# Patient Record
Sex: Female | Born: 1935 | Race: Black or African American | Hispanic: No | Smoking: Current every day smoker
Health system: Southern US, Community
[De-identification: ages and names within clinical notes are randomized; demographics above are authoritative.]

## PROBLEM LIST (undated history)

## (undated) DIAGNOSIS — I10 Essential (primary) hypertension: Secondary | ICD-10-CM

## (undated) DIAGNOSIS — N289 Disorder of kidney and ureter, unspecified: Secondary | ICD-10-CM

## (undated) DIAGNOSIS — E119 Type 2 diabetes mellitus without complications: Secondary | ICD-10-CM

## (undated) DIAGNOSIS — K219 Gastro-esophageal reflux disease without esophagitis: Secondary | ICD-10-CM

## (undated) DIAGNOSIS — F0390 Unspecified dementia without behavioral disturbance: Secondary | ICD-10-CM

## (undated) DIAGNOSIS — J449 Chronic obstructive pulmonary disease, unspecified: Secondary | ICD-10-CM

---

## 2003-12-11 ENCOUNTER — Other Ambulatory Visit: Payer: Self-pay

## 2005-03-20 ENCOUNTER — Emergency Department: Payer: Self-pay | Admitting: Emergency Medicine

## 2005-03-20 ENCOUNTER — Other Ambulatory Visit: Payer: Self-pay

## 2005-04-12 ENCOUNTER — Inpatient Hospital Stay: Payer: Self-pay | Admitting: Internal Medicine

## 2005-04-12 ENCOUNTER — Other Ambulatory Visit: Payer: Self-pay

## 2005-10-18 ENCOUNTER — Inpatient Hospital Stay: Payer: Self-pay | Admitting: Unknown Physician Specialty

## 2005-10-18 ENCOUNTER — Other Ambulatory Visit: Payer: Self-pay

## 2006-03-16 ENCOUNTER — Emergency Department: Payer: Self-pay | Admitting: Emergency Medicine

## 2006-06-05 ENCOUNTER — Ambulatory Visit: Payer: Self-pay | Admitting: Internal Medicine

## 2006-11-24 ENCOUNTER — Ambulatory Visit: Payer: Self-pay | Admitting: Unknown Physician Specialty

## 2007-06-08 ENCOUNTER — Ambulatory Visit: Payer: Self-pay | Admitting: Internal Medicine

## 2007-07-16 ENCOUNTER — Ambulatory Visit: Payer: Self-pay

## 2007-07-31 ENCOUNTER — Observation Stay: Payer: Self-pay | Admitting: Internal Medicine

## 2007-07-31 ENCOUNTER — Other Ambulatory Visit: Payer: Self-pay

## 2007-08-24 ENCOUNTER — Other Ambulatory Visit: Payer: Self-pay

## 2007-08-24 ENCOUNTER — Emergency Department: Payer: Self-pay | Admitting: Emergency Medicine

## 2008-12-03 ENCOUNTER — Inpatient Hospital Stay: Payer: Self-pay | Admitting: Internal Medicine

## 2008-12-23 ENCOUNTER — Ambulatory Visit: Payer: Self-pay | Admitting: Vascular Surgery

## 2009-10-08 ENCOUNTER — Ambulatory Visit: Payer: Self-pay | Admitting: Internal Medicine

## 2009-12-10 ENCOUNTER — Inpatient Hospital Stay: Payer: Self-pay | Admitting: *Deleted

## 2009-12-15 ENCOUNTER — Emergency Department: Payer: Self-pay | Admitting: Emergency Medicine

## 2009-12-20 ENCOUNTER — Inpatient Hospital Stay: Payer: Self-pay | Admitting: Internal Medicine

## 2010-01-28 ENCOUNTER — Ambulatory Visit: Payer: Self-pay | Admitting: Internal Medicine

## 2010-06-12 ENCOUNTER — Emergency Department: Payer: Self-pay | Admitting: Emergency Medicine

## 2010-11-08 ENCOUNTER — Ambulatory Visit: Payer: Self-pay | Admitting: Internal Medicine

## 2011-04-18 ENCOUNTER — Emergency Department: Payer: Self-pay | Admitting: *Deleted

## 2011-11-24 ENCOUNTER — Emergency Department: Payer: Self-pay | Admitting: Emergency Medicine

## 2011-11-24 LAB — BASIC METABOLIC PANEL
Anion Gap: 10 (ref 7–16)
BUN: 25 mg/dL — ABNORMAL HIGH (ref 7–18)
Calcium, Total: 8.8 mg/dL (ref 8.5–10.1)
Chloride: 105 mmol/L (ref 98–107)
Co2: 25 mmol/L (ref 21–32)
Creatinine: 1.31 mg/dL — ABNORMAL HIGH (ref 0.60–1.30)
EGFR (African American): 50 — ABNORMAL LOW
Osmolality: 286 (ref 275–301)
Potassium: 4.3 mmol/L (ref 3.5–5.1)
Sodium: 140 mmol/L (ref 136–145)

## 2011-11-24 LAB — CBC
HCT: 38.5 % (ref 35.0–47.0)
MCHC: 32.9 g/dL (ref 32.0–36.0)
Platelet: 248 10*3/uL (ref 150–440)
RDW: 13.5 % (ref 11.5–14.5)
WBC: 13.8 10*3/uL — ABNORMAL HIGH (ref 3.6–11.0)

## 2011-11-24 LAB — URIC ACID: Uric Acid: 5.3 mg/dL (ref 2.6–6.0)

## 2012-01-10 ENCOUNTER — Ambulatory Visit: Payer: Self-pay | Admitting: Internal Medicine

## 2012-02-08 ENCOUNTER — Ambulatory Visit: Payer: Self-pay | Admitting: Internal Medicine

## 2012-02-17 ENCOUNTER — Inpatient Hospital Stay: Payer: Self-pay | Admitting: Internal Medicine

## 2012-02-17 LAB — DRUG SCREEN, URINE
Barbiturates, Ur Screen: NEGATIVE (ref ?–200)
Benzodiazepine, Ur Scrn: NEGATIVE (ref ?–200)
Cannabinoid 50 Ng, Ur ~~LOC~~: NEGATIVE (ref ?–50)
Cocaine Metabolite,Ur ~~LOC~~: NEGATIVE (ref ?–300)
MDMA (Ecstasy)Ur Screen: NEGATIVE (ref ?–500)
Methadone, Ur Screen: NEGATIVE (ref ?–300)
Opiate, Ur Screen: NEGATIVE (ref ?–300)
Phencyclidine (PCP) Ur S: NEGATIVE (ref ?–25)

## 2012-02-17 LAB — COMPREHENSIVE METABOLIC PANEL
Albumin: 3.8 g/dL (ref 3.4–5.0)
Alkaline Phosphatase: 75 U/L (ref 50–136)
Anion Gap: 5 — ABNORMAL LOW (ref 7–16)
Calcium, Total: 8.9 mg/dL (ref 8.5–10.1)
Chloride: 106 mmol/L (ref 98–107)
Co2: 31 mmol/L (ref 21–32)
Creatinine: 1.52 mg/dL — ABNORMAL HIGH (ref 0.60–1.30)
EGFR (Non-African Amer.): 32 — ABNORMAL LOW
Glucose: 88 mg/dL (ref 65–99)
Osmolality: 288 (ref 275–301)
Potassium: 4.1 mmol/L (ref 3.5–5.1)
Sodium: 142 mmol/L (ref 136–145)

## 2012-02-17 LAB — CK TOTAL AND CKMB (NOT AT ARMC)
CK, Total: 8110 U/L — ABNORMAL HIGH (ref 21–215)
CK, Total: 8148 U/L — ABNORMAL HIGH (ref 21–215)
CK-MB: 29.7 ng/mL — ABNORMAL HIGH (ref 0.5–3.6)
CK-MB: 18.9 ng/mL — ABNORMAL HIGH (ref 0.5–3.6)

## 2012-02-17 LAB — URINALYSIS, COMPLETE
Bacteria: NONE SEEN
Bilirubin,UR: NEGATIVE
Glucose,UR: 150 mg/dL (ref 0–75)
Hyaline Cast: 19
Ph: 5 (ref 4.5–8.0)
RBC,UR: <1 /HPF (ref 0–5)
Specific Gravity: 1.018 (ref 1.003–1.030)
Squamous Epithelial: <1
WBC UR: 2 /HPF (ref 0–5)

## 2012-02-17 LAB — CBC
HCT: 39.6 % (ref 35.0–47.0)
HGB: 13.2 g/dL (ref 12.0–16.0)
MCH: 32.4 pg (ref 26.0–34.0)
MCHC: 33.4 g/dL (ref 32.0–36.0)
Platelet: 228 10*3/uL (ref 150–440)
RBC: 4.08 10*6/uL (ref 3.80–5.20)
RDW: 14 % (ref 11.5–14.5)

## 2012-02-17 LAB — PROTIME-INR: INR: 1

## 2012-02-18 LAB — CK TOTAL AND CKMB (NOT AT ARMC)
CK, Total: 7280 U/L — ABNORMAL HIGH (ref 21–215)
CK-MB: 11.1 ng/mL — ABNORMAL HIGH (ref 0.5–3.6)

## 2012-02-18 LAB — COMPREHENSIVE METABOLIC PANEL
Albumin: 3.1 g/dL — ABNORMAL LOW (ref 3.4–5.0)
Alkaline Phosphatase: 58 U/L (ref 50–136)
Chloride: 110 mmol/L — ABNORMAL HIGH (ref 98–107)
Osmolality: 290 (ref 275–301)
SGOT(AST): 177 U/L — ABNORMAL HIGH (ref 15–37)
Sodium: 144 mmol/L (ref 136–145)

## 2012-02-18 LAB — CBC WITH DIFFERENTIAL/PLATELET
Basophil #: 0 10*3/uL (ref 0.0–0.1)
Basophil %: 0.5 %
Eosinophil #: 0.1 10*3/uL (ref 0.0–0.7)
Eosinophil %: 1.4 %
HCT: 34.3 % — ABNORMAL LOW (ref 35.0–47.0)
HGB: 11.6 g/dL — ABNORMAL LOW (ref 12.0–16.0)
Lymphocyte #: 1.7 10*3/uL (ref 1.0–3.6)
Lymphocyte %: 26.2 %
MCH: 33 pg (ref 26.0–34.0)
MCHC: 33.9 g/dL (ref 32.0–36.0)
MCV: 97 fL (ref 80–100)
Monocyte #: 0.6 x10 3/mm (ref 0.2–0.9)
Monocyte %: 9.7 %
Neutrophil %: 62.2 %
Platelet: 204 10*3/uL (ref 150–440)

## 2012-02-18 LAB — TROPONIN I: Troponin-I: 0.04 ng/mL

## 2012-02-19 LAB — URINE CULTURE

## 2012-02-20 LAB — CBC WITH DIFFERENTIAL/PLATELET
Basophil #: 0 10*3/uL (ref 0.0–0.1)
Eosinophil #: 0.2 10*3/uL (ref 0.0–0.7)
Eosinophil %: 3 %
HGB: 11.9 g/dL — ABNORMAL LOW (ref 12.0–16.0)
Lymphocyte #: 2.3 10*3/uL (ref 1.0–3.6)
MCH: 32.1 pg (ref 26.0–34.0)
MCHC: 33.4 g/dL (ref 32.0–36.0)
Monocyte #: 0.4 x10 3/mm (ref 0.2–0.9)
Monocyte %: 7.5 %
Neutrophil #: 2.9 10*3/uL (ref 1.4–6.5)
Platelet: 219 10*3/uL (ref 150–440)
RBC: 3.72 10*6/uL — ABNORMAL LOW (ref 3.80–5.20)
RDW: 14.2 % (ref 11.5–14.5)

## 2012-02-20 LAB — BASIC METABOLIC PANEL
Anion Gap: 5 — ABNORMAL LOW (ref 7–16)
BUN: 22 mg/dL — ABNORMAL HIGH (ref 7–18)
Chloride: 108 mmol/L — ABNORMAL HIGH (ref 98–107)
Creatinine: 1.23 mg/dL (ref 0.60–1.30)
EGFR (African American): 48 — ABNORMAL LOW
EGFR (Non-African Amer.): 41 — ABNORMAL LOW
Osmolality: 284 (ref 275–301)
Sodium: 140 mmol/L (ref 136–145)

## 2012-02-27 ENCOUNTER — Ambulatory Visit: Payer: Self-pay | Admitting: Vascular Surgery

## 2012-02-27 LAB — BASIC METABOLIC PANEL
Anion Gap: 5 — ABNORMAL LOW (ref 7–16)
BUN: 23 mg/dL — ABNORMAL HIGH (ref 7–18)
Calcium, Total: 8.8 mg/dL (ref 8.5–10.1)
Co2: 32 mmol/L (ref 21–32)
EGFR (African American): 35 — ABNORMAL LOW
Sodium: 139 mmol/L (ref 136–145)

## 2012-03-01 ENCOUNTER — Inpatient Hospital Stay: Payer: Self-pay | Admitting: Internal Medicine

## 2012-03-01 LAB — URINALYSIS, COMPLETE
Bacteria: NONE SEEN
Bilirubin,UR: NEGATIVE
Blood: NEGATIVE
Glucose,UR: NEGATIVE mg/dL (ref 0–75)
Hyaline Cast: 30
Ketone: NEGATIVE
Ph: 5 (ref 4.5–8.0)
Protein: NEGATIVE
RBC,UR: 2 /HPF (ref 0–5)

## 2012-03-01 LAB — COMPREHENSIVE METABOLIC PANEL
Alkaline Phosphatase: 60 U/L (ref 50–136)
Anion Gap: 10 (ref 7–16)
Bilirubin,Total: 0.2 mg/dL (ref 0.2–1.0)
Calcium, Total: 8.4 mg/dL — ABNORMAL LOW (ref 8.5–10.1)
Chloride: 103 mmol/L (ref 98–107)
Creatinine: 2.08 mg/dL — ABNORMAL HIGH (ref 0.60–1.30)
EGFR (African American): 25 — ABNORMAL LOW
EGFR (Non-African Amer.): 22 — ABNORMAL LOW
Osmolality: 290 (ref 275–301)
SGOT(AST): 23 U/L (ref 15–37)
SGPT (ALT): 17 U/L
Sodium: 140 mmol/L (ref 136–145)
Total Protein: 7 g/dL (ref 6.4–8.2)

## 2012-03-01 LAB — CBC
HCT: 34.4 % — ABNORMAL LOW (ref 35.0–47.0)
HGB: 11.3 g/dL — ABNORMAL LOW (ref 12.0–16.0)
MCH: 32 pg (ref 26.0–34.0)
Platelet: 224 10*3/uL (ref 150–440)
RDW: 14.5 % (ref 11.5–14.5)

## 2012-03-01 LAB — CK TOTAL AND CKMB (NOT AT ARMC): CK-MB: 1.2 ng/mL (ref 0.5–3.6)

## 2012-03-01 LAB — TROPONIN I: Troponin-I: <0.02 ng/mL

## 2012-03-02 LAB — BASIC METABOLIC PANEL
BUN: 27 mg/dL — ABNORMAL HIGH (ref 7–18)
Calcium, Total: 8.3 mg/dL — ABNORMAL LOW (ref 8.5–10.1)
Chloride: 107 mmol/L (ref 98–107)
EGFR (African American): 42 — ABNORMAL LOW
Osmolality: 297 (ref 275–301)
Potassium: 5 mmol/L (ref 3.5–5.1)

## 2012-03-03 LAB — CBC WITH DIFFERENTIAL/PLATELET
Basophil %: 0.3 %
Eosinophil #: 0.1 10*3/uL (ref 0.0–0.7)
Eosinophil %: 0.6 %
HCT: 34.4 % — ABNORMAL LOW (ref 35.0–47.0)
Lymphocyte %: 19.6 %
MCHC: 33.1 g/dL (ref 32.0–36.0)
Monocyte #: 0.8 x10 3/mm (ref 0.2–0.9)
Platelet: 257 10*3/uL (ref 150–440)
RBC: 3.58 10*6/uL — ABNORMAL LOW (ref 3.80–5.20)
RDW: 14.6 % — ABNORMAL HIGH (ref 11.5–14.5)

## 2012-03-03 LAB — BASIC METABOLIC PANEL
BUN: 21 mg/dL — ABNORMAL HIGH (ref 7–18)
Chloride: 108 mmol/L — ABNORMAL HIGH (ref 98–107)
Co2: 28 mmol/L (ref 21–32)
Creatinine: 1.25 mg/dL (ref 0.60–1.30)
EGFR (African American): 47 — ABNORMAL LOW
EGFR (Non-African Amer.): 40 — ABNORMAL LOW
Potassium: 4.6 mmol/L (ref 3.5–5.1)

## 2012-03-04 LAB — CBC WITH DIFFERENTIAL/PLATELET
Basophil #: 0 10*3/uL (ref 0.0–0.1)
Basophil %: 0.3 %
Eosinophil %: 0.4 %
Lymphocyte %: 22.3 %
MCH: 32.4 pg (ref 26.0–34.0)
MCHC: 33.7 g/dL (ref 32.0–36.0)
MCV: 96 fL (ref 80–100)
Monocyte #: 0.7 x10 3/mm (ref 0.2–0.9)
Neutrophil #: 6.6 10*3/uL — ABNORMAL HIGH (ref 1.4–6.5)
Platelet: 256 10*3/uL (ref 150–440)
WBC: 9.5 10*3/uL (ref 3.6–11.0)

## 2012-03-04 LAB — BASIC METABOLIC PANEL
Co2: 28 mmol/L (ref 21–32)
EGFR (Non-African Amer.): 37 — ABNORMAL LOW
Osmolality: 290 (ref 275–301)

## 2012-03-05 LAB — CBC WITH DIFFERENTIAL/PLATELET
Basophil #: 0 10*3/uL (ref 0.0–0.1)
Eosinophil %: 2.3 %
HCT: 35.4 % (ref 35.0–47.0)
HGB: 11.8 g/dL — ABNORMAL LOW (ref 12.0–16.0)
Lymphocyte #: 3.2 10*3/uL (ref 1.0–3.6)
MCH: 32.1 pg (ref 26.0–34.0)
MCHC: 33.3 g/dL (ref 32.0–36.0)
MCV: 97 fL (ref 80–100)
Monocyte %: 7.8 %
Neutrophil %: 55.3 %
Platelet: 256 10*3/uL (ref 150–440)
RBC: 3.67 10*6/uL — ABNORMAL LOW (ref 3.80–5.20)
WBC: 9.4 10*3/uL (ref 3.6–11.0)

## 2012-03-05 LAB — BASIC METABOLIC PANEL
Anion Gap: 8 (ref 7–16)
BUN: 25 mg/dL — ABNORMAL HIGH (ref 7–18)
Co2: 29 mmol/L (ref 21–32)
Creatinine: 1.39 mg/dL — ABNORMAL HIGH (ref 0.60–1.30)
Glucose: 90 mg/dL (ref 65–99)
Osmolality: 287 (ref 275–301)
Potassium: 4.2 mmol/L (ref 3.5–5.1)

## 2012-03-10 ENCOUNTER — Ambulatory Visit: Payer: Self-pay | Admitting: Internal Medicine

## 2012-04-29 ENCOUNTER — Emergency Department: Payer: Self-pay | Admitting: Emergency Medicine

## 2013-02-09 IMAGING — XA IR VASCULAR PROCEDURE
12 of 16 series · 15 of 24 positions shown · IV contrast (IODINE)
Comparison: none

[Series 1: care aorta · 1 of 2 slices shown (1 of 11)]
[im 1/2]
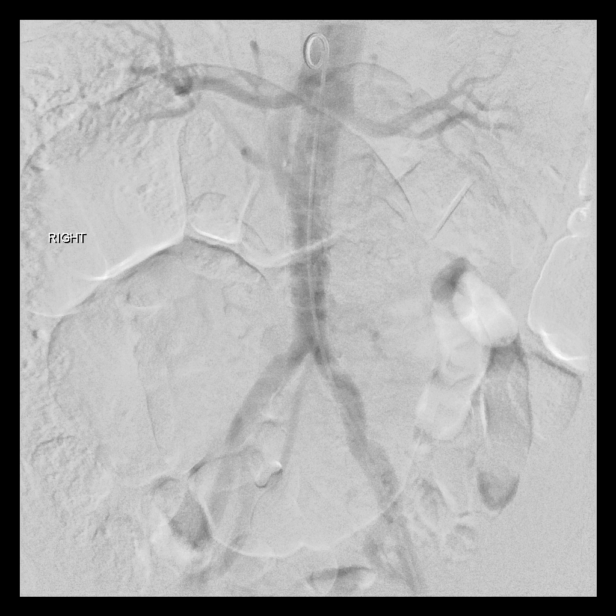

[Series 3: care aorta · 1 of 2 slices shown (2 of 11)]
[im 1/2]
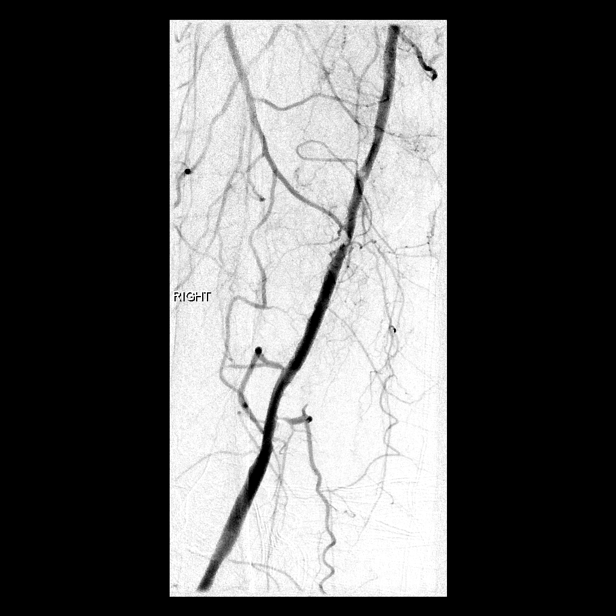

[Series 5: care aorta · 1 of 2 slices shown (3 of 11)]
[im 1/2]
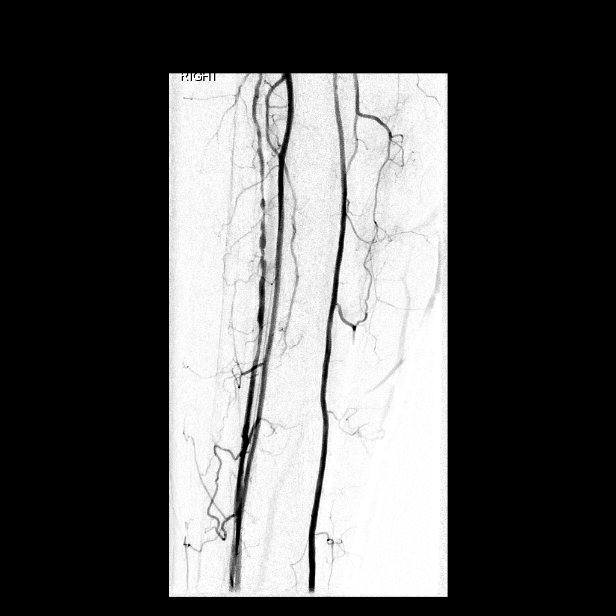

[Series 6: care aorta · 1 of 2 slices shown (4 of 11)]
[im 1/2]
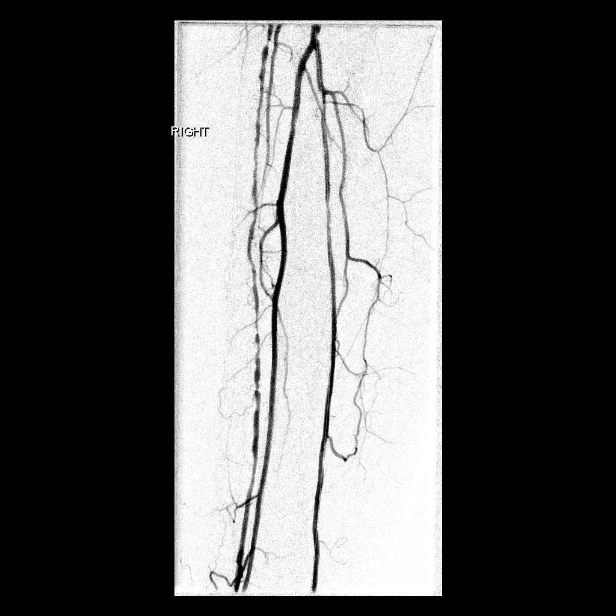

[Series 7: fl - angio · 1 of 1 slices shown]
[im 1/1]
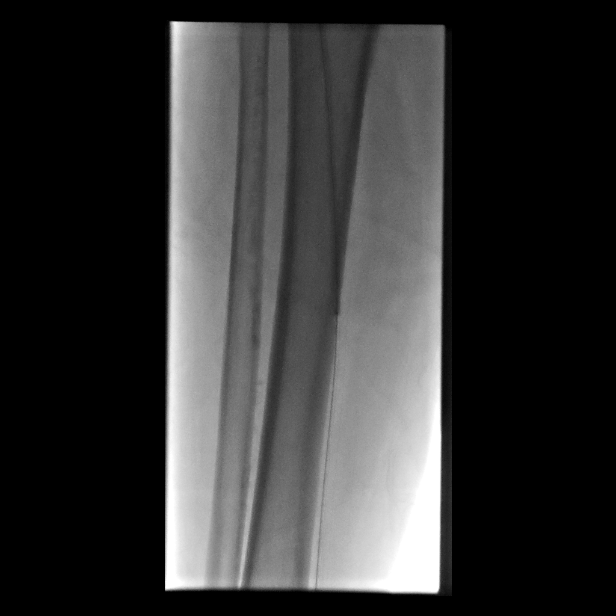

[Series 8: care aorta · 1 of 2 slices shown (5 of 11)]
[im 1/2]
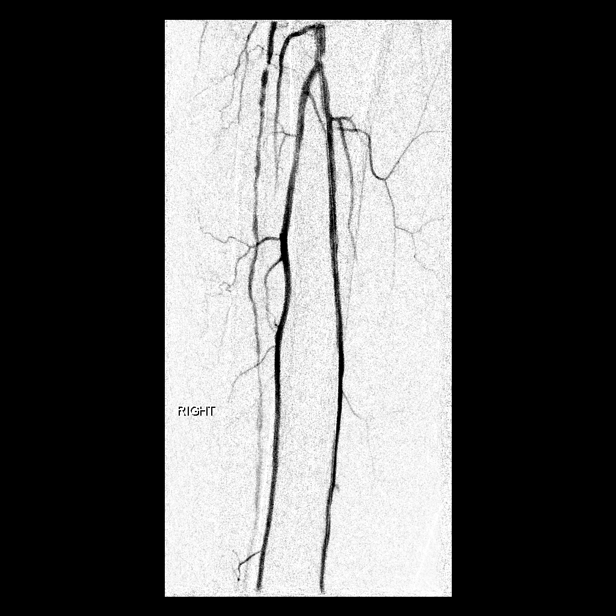

[Series 9: care aorta · 1 of 2 slices shown (6 of 11)]
[im 1/2]
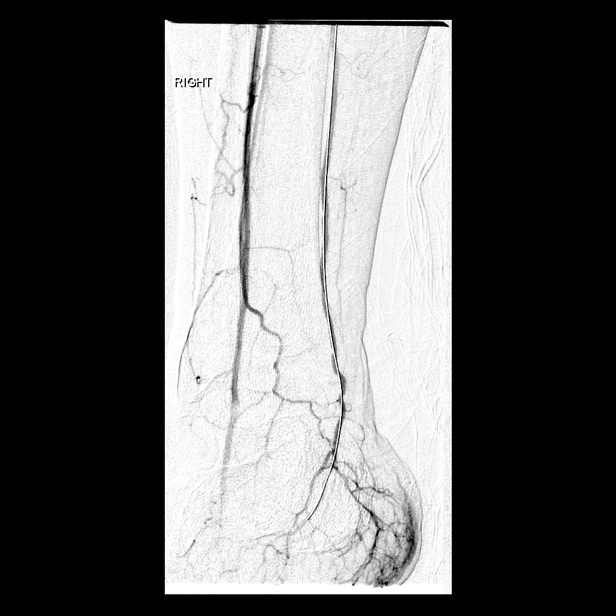

[Series 10: care aorta · 2 of 3 slices shown (7 of 11)]
[im 1/3]
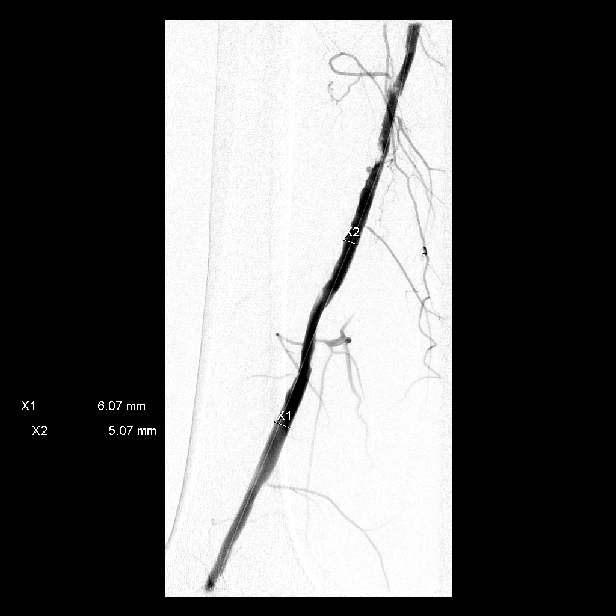
[im 3/3]
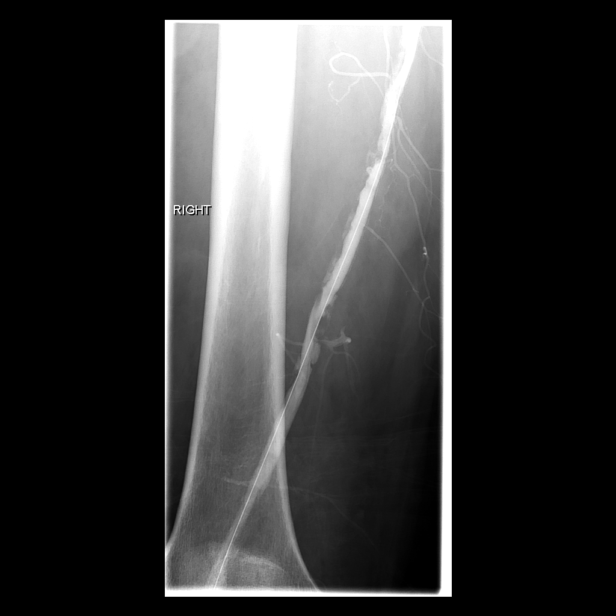

[Series 12: care aorta · 2 of 3 slices shown (8 of 11)]
[im 1/3]
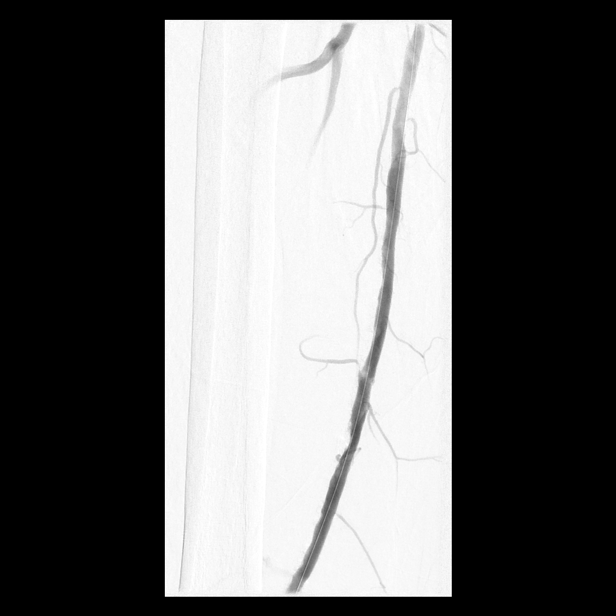
[im 3/3]
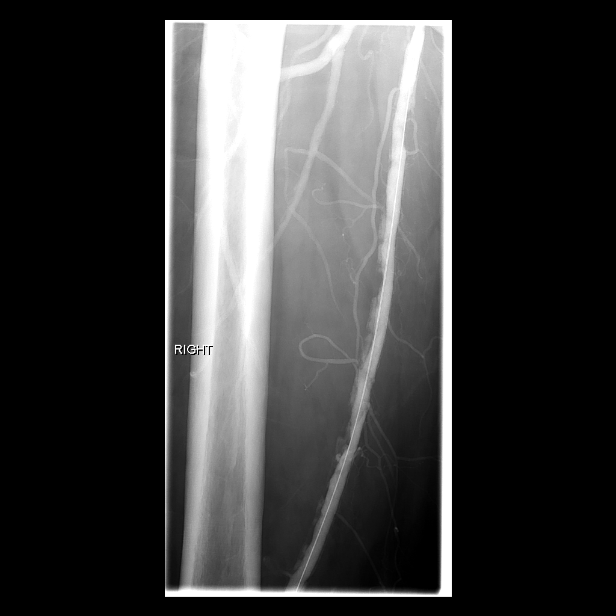

[Series 14: care aorta · 1 of 2 slices shown (9 of 11)]
[im 1/2]
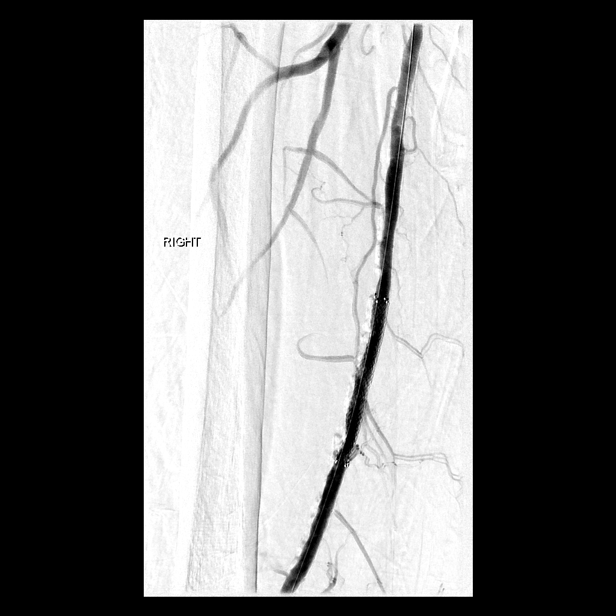

[Series 15: care aorta · 2 of 2 slices shown (10 of 11)]
[im 1/2]
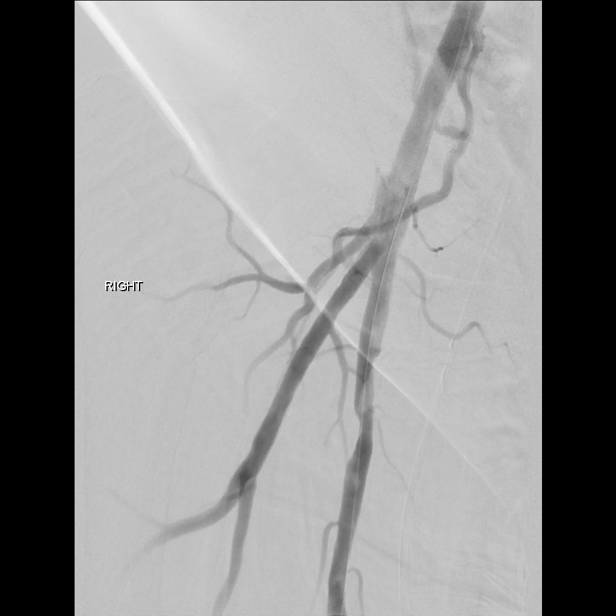
[im 2/2]
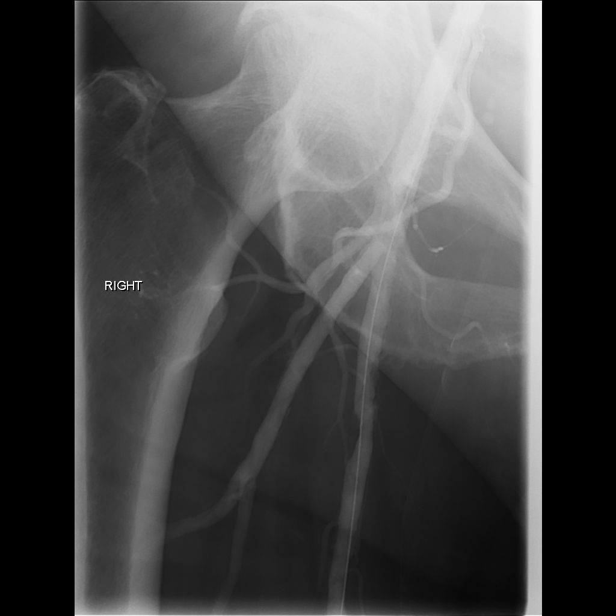

[Series 16: care aorta · 1 of 2 slices shown (11 of 11)]
[im 2/2]
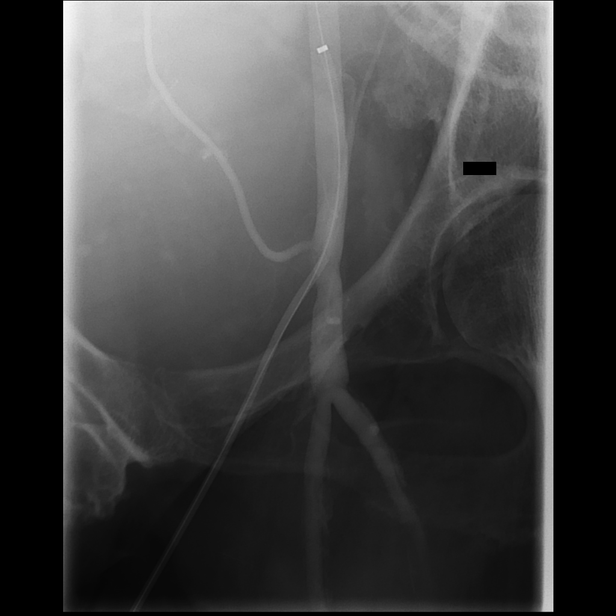

[15 of 24 positions shown; findings below may reference images not displayed]

IMAGES IMPORTED FROM THE [employer] WORKFLOW SYSTEM
NO DICTATION FOR STUDY

## 2013-02-12 IMAGING — CR DG CHEST 2V
1 series · 2 of 2 positions shown · non-contrast
Comparison: none

REASON FOR EXAM: cough, wheezing
COMMENTS:

PROCEDURE:     DXR - DXR CHEST PA (OR AP) AND LATERAL  - March 01, 2012  [DATE]
RESULT:     The lungs are clear. The cardiac silhouette and visualized bony
skeleton are unremarkable.

[Series 1: ap · 0.17mm/px · 2 of 2 slices shown]
[im 1/2]
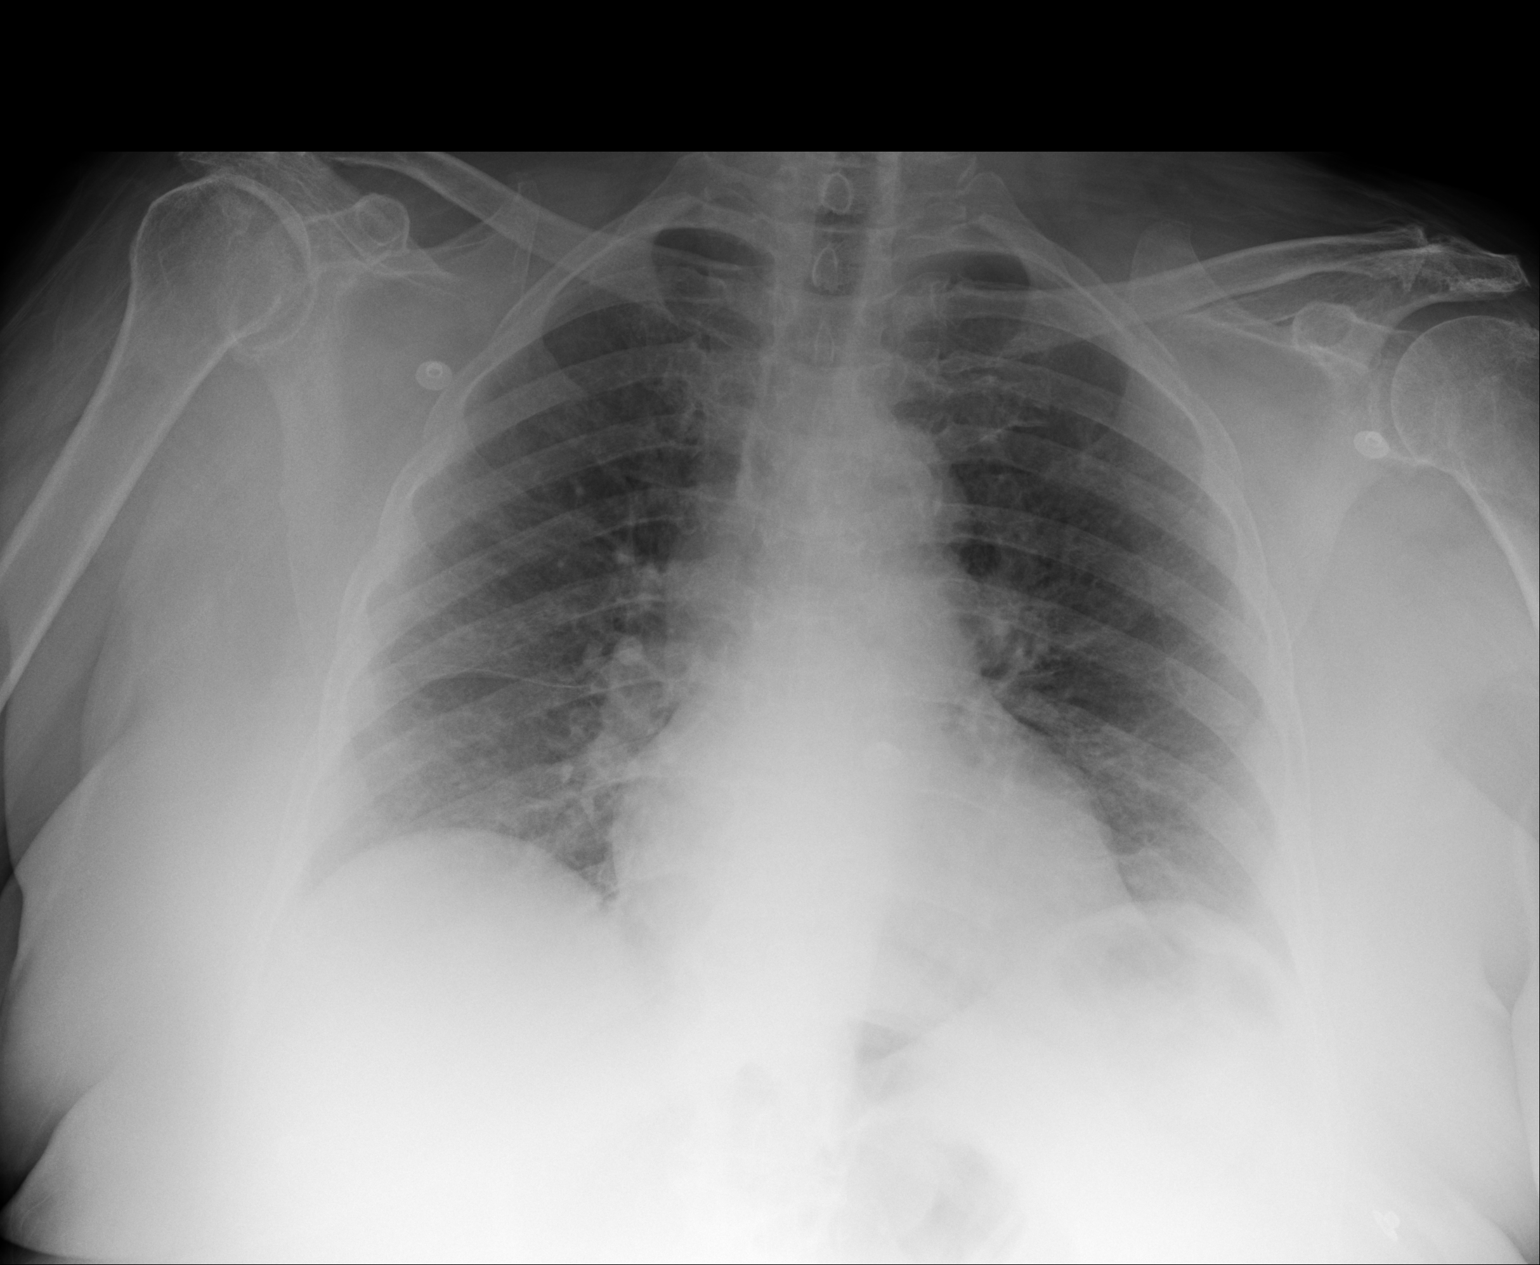
[im 2/2]
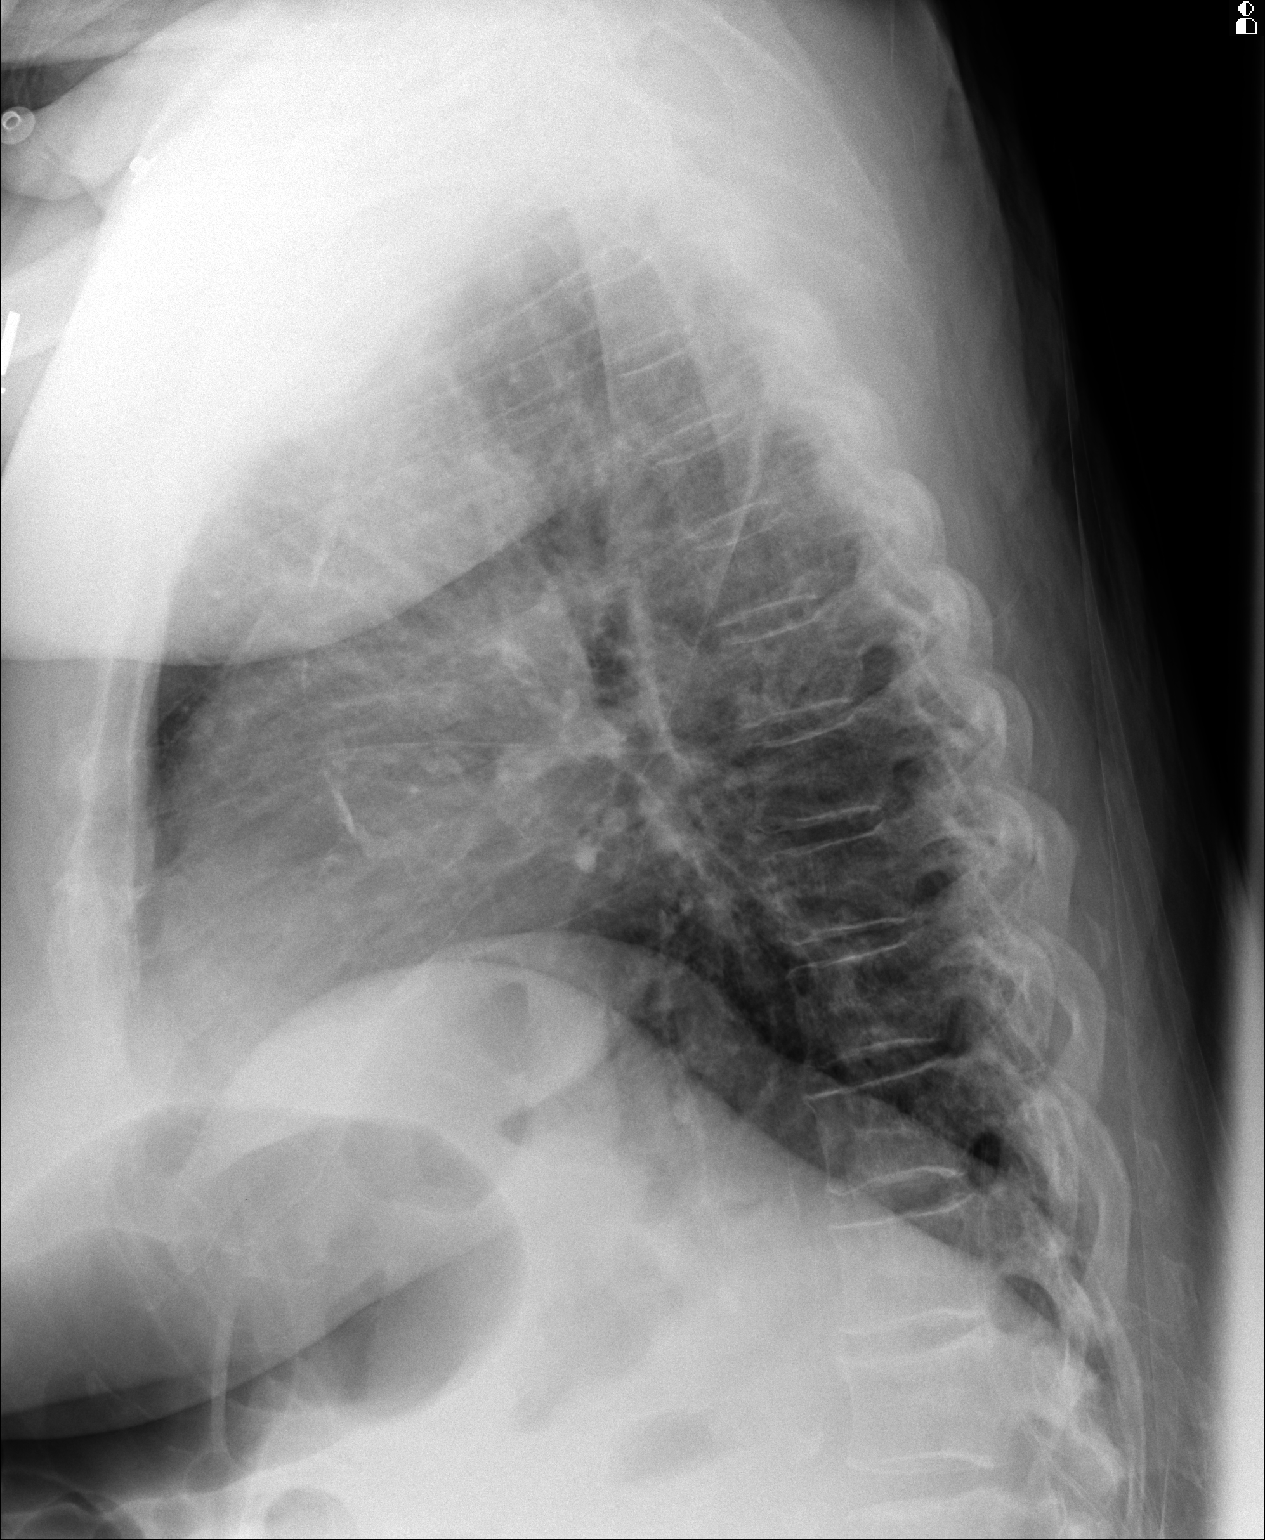

[2 of 2 positions shown; findings below may reference images not displayed]

IMPRESSION: 1. Chest radiograph without evidence of acute cardiopulmonary disease.
2. Comparison was made to prior study dated 06/12/2010.

## 2013-02-12 IMAGING — CR PELVIS - 1-2 VIEW
1 series · 1 of 1 positions shown · non-contrast
Comparison: none

REASON FOR EXAM: fall, pain
COMMENTS:

PROCEDURE:     DXR - DXR PELVIS AP ONLY  - March 01, 2012  [DATE]
RESULT:     There is no evidence of fracture, dislocation, or malalignment.

[ap]
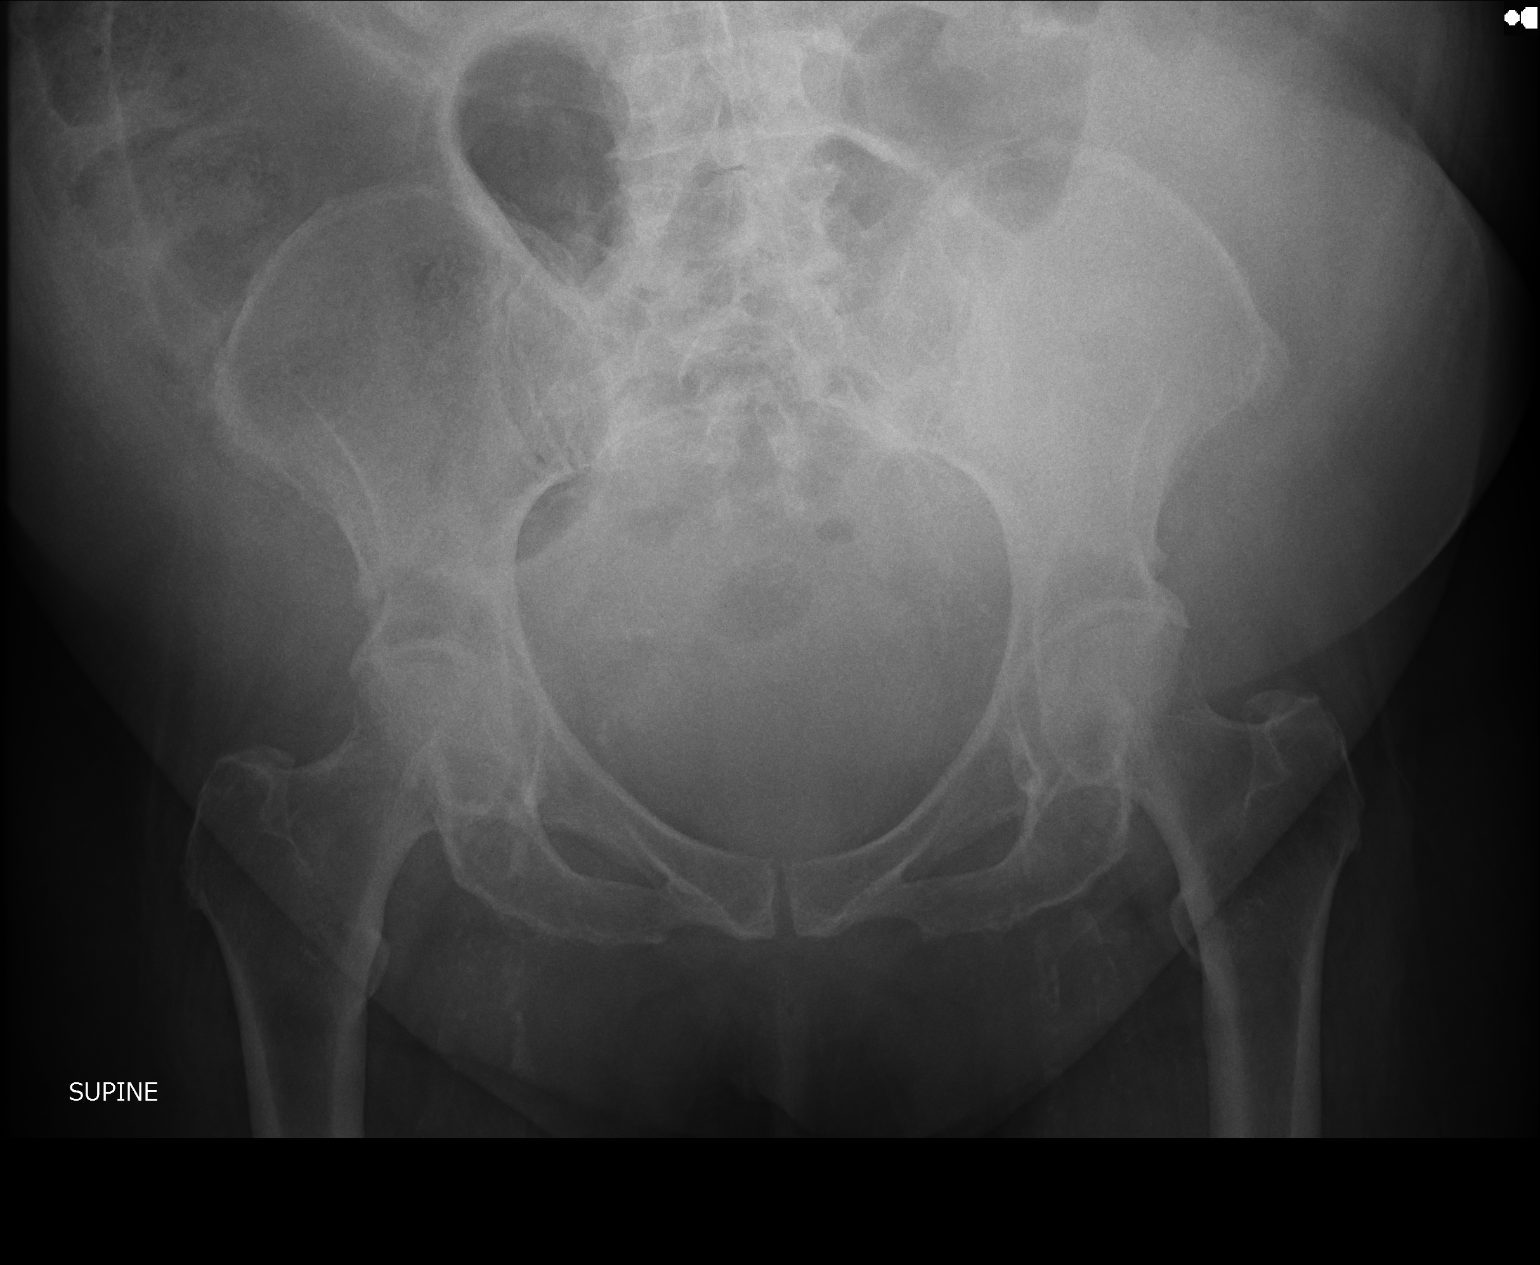

[1 of 1 positions shown; findings below may reference images not displayed]

IMPRESSION: 1. No evidence of acute abnormalities.
2. If there are persistent complaints of pain or persistent clinical
concern, a repeat evaluation in 7-10 days is recommended if clinically
warranted.

2. Comparison made to prior study dated 02/17/2012.

## 2013-02-16 IMAGING — CR RIGHT FOOT COMPLETE - 3+ VIEW
1 series · 3 of 3 positions shown · non-contrast
Comparison: none

REASON FOR EXAM: bruising and pain to right heel
COMMENTS:

[Series 12: x foot ap right · 0.14mm/px · 3 of 3 slices shown]
[im 1/3]
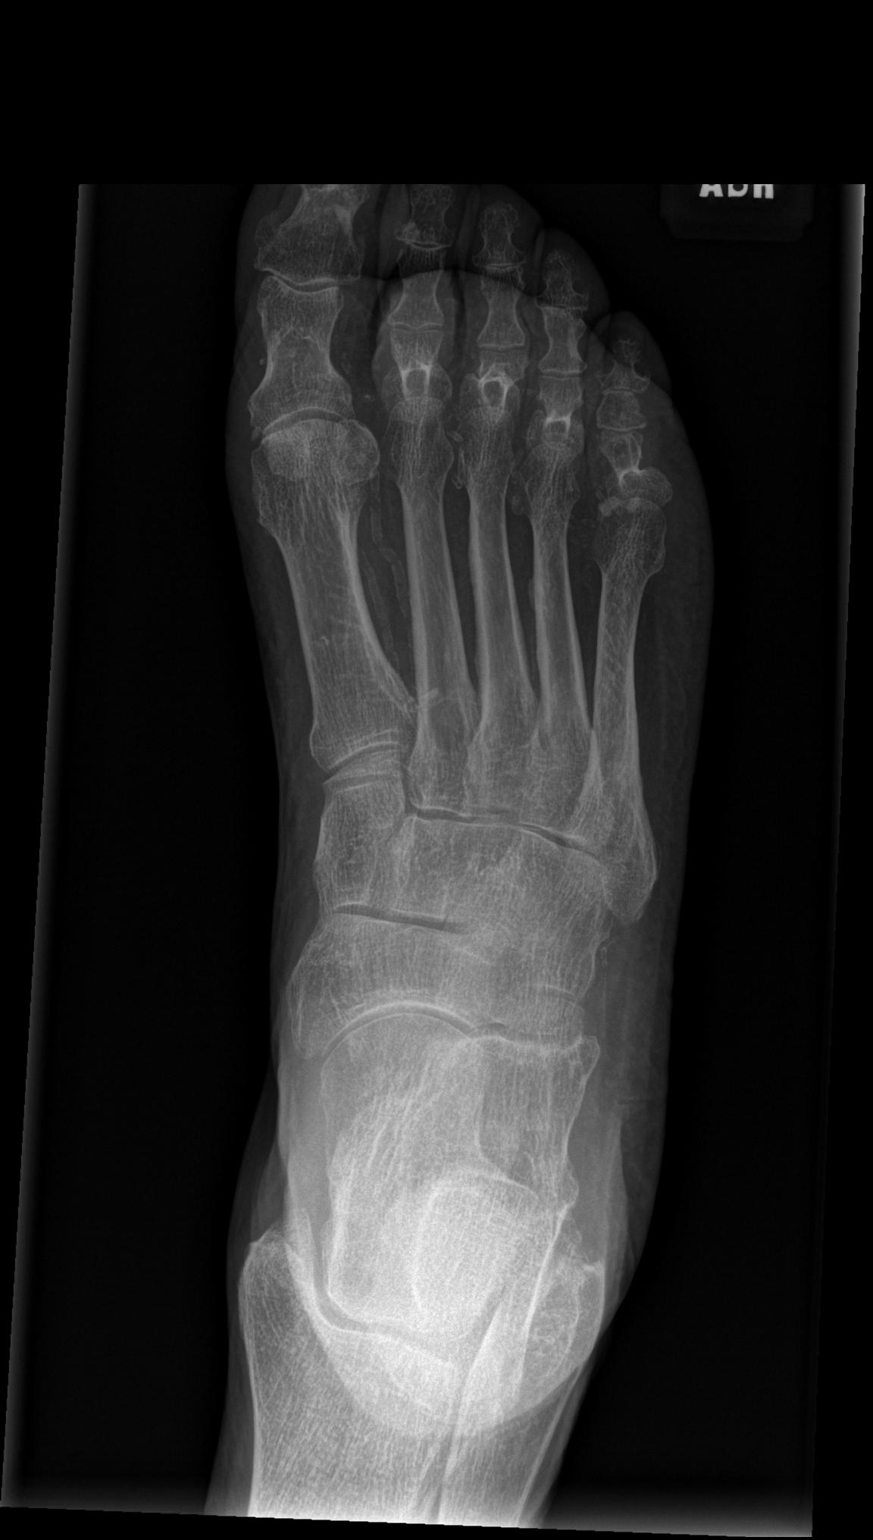
[im 2/3]
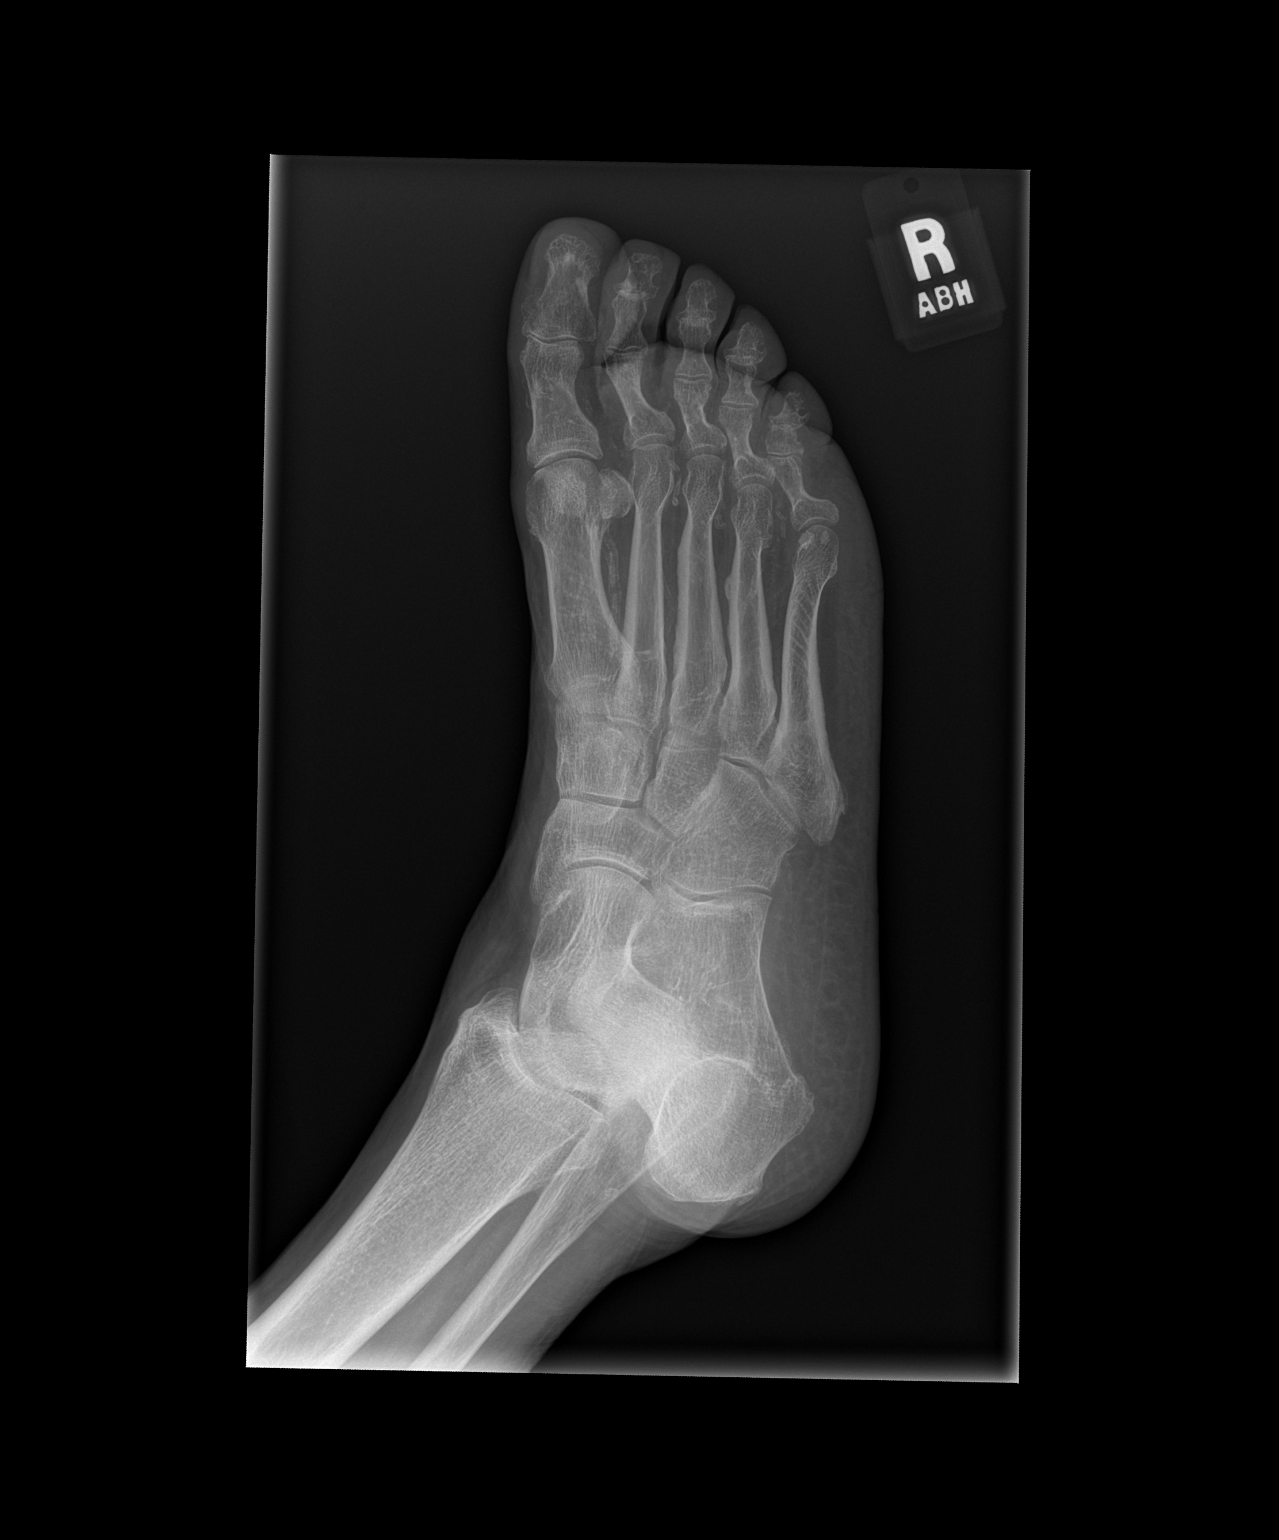
[im 3/3]
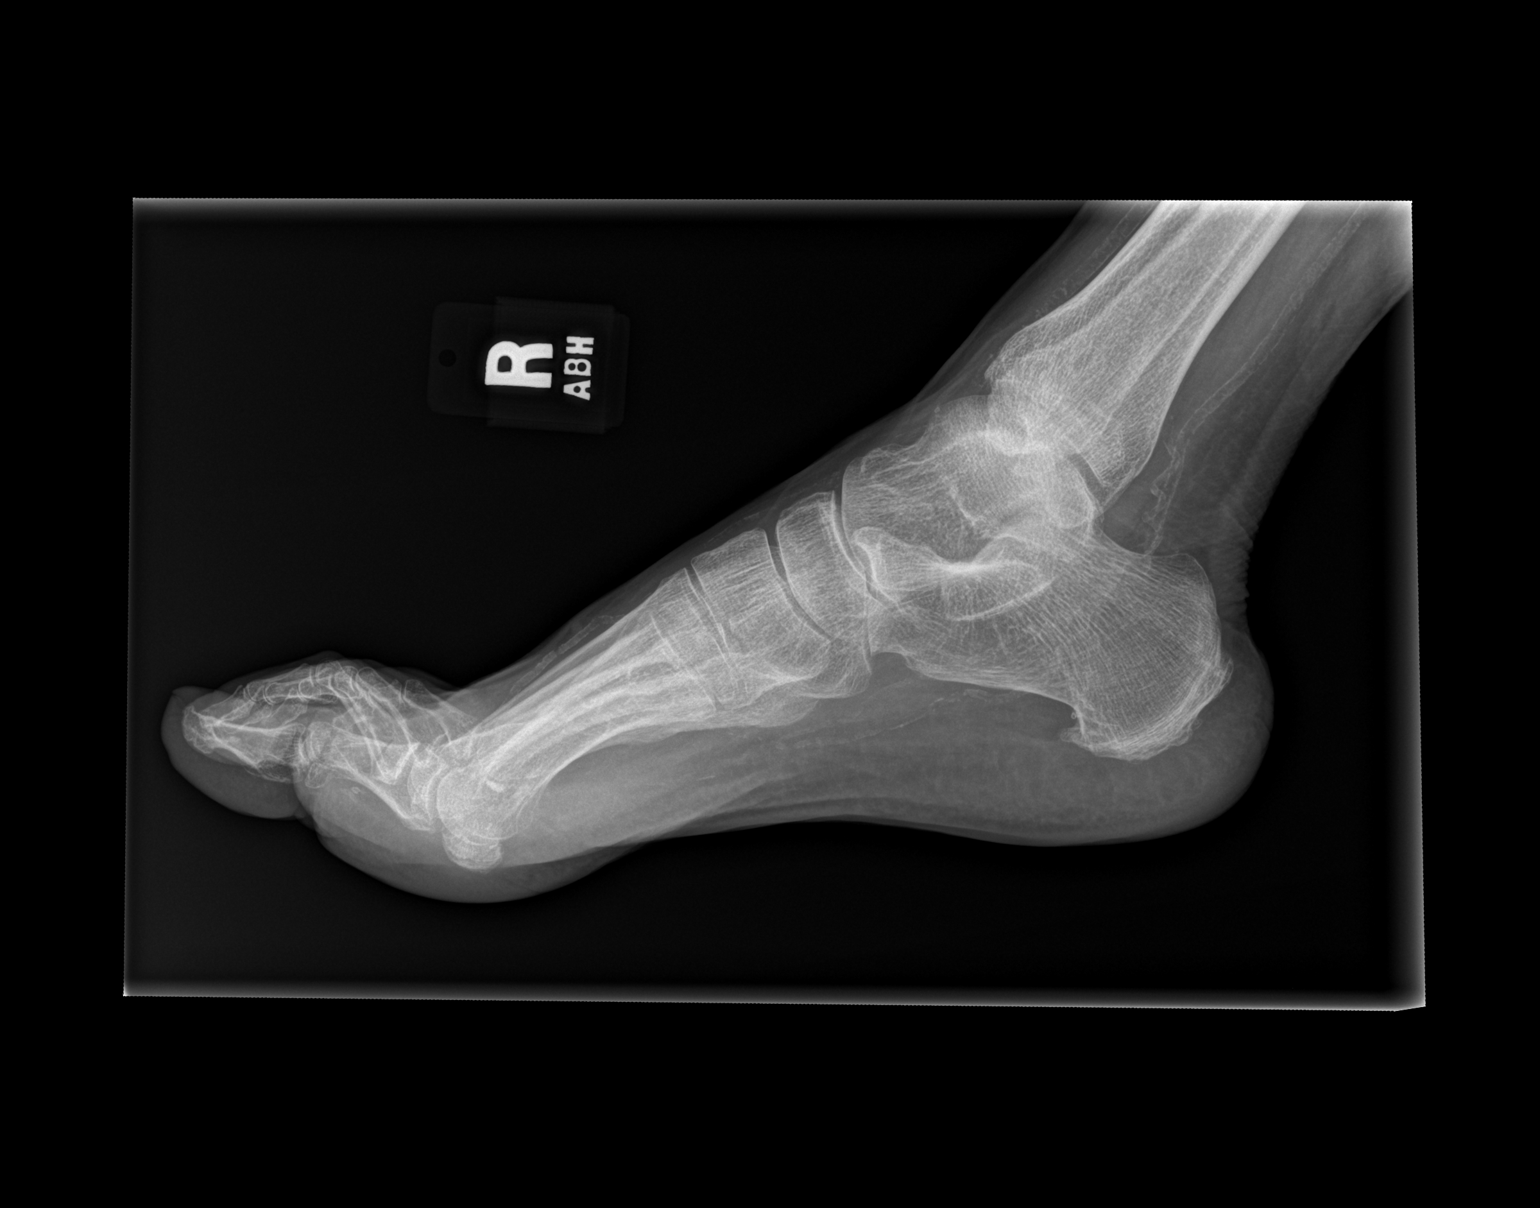

[3 of 3 positions shown; findings below may reference images not displayed]

PROCEDURE:     DXR - DXR FOOT RT COMPLETE W/OBLIQUES  - March 05, 2012 [DATE]

RESULT:     No fracture, dislocation or other acute bony abnormality is
identified. Incidental note is made of a small plantar calcaneal spur. There
is demineralization compatible with a history of osteoporosis or osteopenia.
There is prominent vascular calcification including the anterior and
posterior tibial arteries and the interdigital vessels.
IMPRESSION: 1.  No acute bony abnormalities are identified.
2.  No soft tissue calcification is seen.
3.  No lytic or blastic lesions are noted.
4.  There is a small plantar calcaneal spur.
5.  Vascular calcification is evident.

[REDACTED]

## 2014-06-30 ENCOUNTER — Ambulatory Visit: Payer: Self-pay | Admitting: Vascular Surgery

## 2014-06-30 LAB — BASIC METABOLIC PANEL
Anion Gap: 6 — ABNORMAL LOW (ref 7–16)
BUN: 20 mg/dL — AB (ref 7–18)
CHLORIDE: 105 mmol/L (ref 98–107)
CO2: 28 mmol/L (ref 21–32)
Calcium, Total: 8.9 mg/dL (ref 8.5–10.1)
Creatinine: 1.45 mg/dL — ABNORMAL HIGH (ref 0.60–1.30)
EGFR (African American): 38 — ABNORMAL LOW
EGFR (Non-African Amer.): 33 — ABNORMAL LOW
Glucose: 88 mg/dL (ref 65–99)
Osmolality: 280 (ref 275–301)
POTASSIUM: 4.7 mmol/L (ref 3.5–5.1)
Sodium: 139 mmol/L (ref 136–145)

## 2014-06-30 LAB — GLUCOSE, RANDOM: Glucose: 144 mg/dL — ABNORMAL HIGH (ref 65–99)

## 2014-09-25 ENCOUNTER — Emergency Department: Payer: Self-pay | Admitting: Emergency Medicine

## 2015-01-31 NOTE — Op Note (Signed)
PATIENT NAME:  [name], TURNER MR#:  [id number] DATE OF BIRTH:  [birth year]  DATE OF PROCEDURE:  06/30/2014   PREOPERATIVE DIAGNOSES:  1.  Peripheral arterial disease with ulceration, left lower extremity.  2.  Status post revascularization, bilateral lower extremities previously.  3.  Dementia.  4.  Diabetes.  5.  Coronary disease.  6.  Hypertension.   POSTOPERATIVE DIAGNOSES: 1.  Peripheral arterial disease with ulceration, left lower extremity.  2.  Status post revascularization, bilateral lower extremities previously.  3.  Dementia.  4.  Diabetes.  5.  Coronary disease.  6.  Hypertension.    PROCEDURES:  1.  Ultrasound guidance for vascular access, right femoral artery.  2.  Catheter placement to left popliteal artery from right femoral approach.  3.  Aortogram and selective left lower extremity angiogram.  4.  Percutaneous transluminal angioplasty of left common femoral artery with 6 mm diameter Lutonix drug-coated angioplasty balloon.  5.  Percutaneous transluminal angioplasty of distal left superficial femoral artery and above-knee popliteal artery with 6 mm diameter Lutonix drug-coated angioplasty balloon.  6.  StarClose closure device, right femoral artery.   SURGEON: Annice Needy, MD    ANESTHESIA: Local with moderate conscious sedation.   ESTIMATED BLOOD LOSS: Minimal.   FLUOROSCOPY TIME: Approximately 5 minutes.   CONTRAST USED: 45 mL.   INDICATION FOR PROCEDURE: This is an 81 year old female who we had treated several years ago for peripheral arterial disease. She returned with ulcerations on 2 toes on her left foot last week to the office.  She is brought in for angiography for further evaluation and potential treatment. Risks and benefits were discussed.  Informed consent was obtained.   DESCRIPTION OF PROCEDURE: The patient is brought to the vascular suite.  Groins were sterilely prepped and draped and a sterile surgical field was created.  The right femoral artery  was visualized with ultrasound and found to be widely patent.  It was then accessed under direct ultrasound guidance without difficulty with a Seldinger needle and a permanent image was recorded. A J-wire and 5 French sheath were then placed. Pigtail catheter was placed in the aorta at the L1 level and AP aortogram was performed. This demonstrated normal flow within the aorta and iliac segments with mild calcific disease, but no flow-limiting stenosis. The renal arteries are patent bilaterally.  I then crossed the aortic bifurcation with the pigtail catheter and a J-wire and advanced to the left femoral head.  Selective left lower extremity angiogram was then performed.  This showed a bulky plaque in the left common femoral artery that appeared to cause a moderate degree of narrowing and stenosis in the 60% range. The proximal SFA and the stent within the SFA were patent without significant stenosis.  However, just below the stent in the distal SFA at the level of Hunter's canal and into the above-knee popliteal artery was another area of moderate stenosis in the 70% range.  She then had 2-vessel runoff distally with small vessel disease seen within the foot.  The peroneal artery and posterior tibial arteries were patent with continuous flow to the foot and the posterior tibial artery to the ankle in the peroneal artery. The patient was heparinized.  A 6 French Ansell sheath was placed over a Truman advantage wire.  With a Truman advantage wire and a Kumpe catheter, I was able to cross the common femoral and the distal SFA involving the popliteal lesions and confirm intraluminal flow in the popliteal artery.  I then  replaced the wire.  Balloon angioplasty was performed first in the distal lesion.  The distal SFA and above-knee popliteal artery were treated with a 6 mm diameter Lutonix drug-coated angioplasty balloon and place at 12 atmospheres.  A waist was seen with inflation that resolved at about 10 to 12  atmospheres, and the inflation was held for 1 minute.  I then deflated the balloon and completion angiogram was performed at this location which showed excellent flow with no significant residual stenosis.  After angioplasty, I then used a 6 mm diameter Lutonix drug-coated angioplasty balloon on the common femoral artery, taken up into the distal external iliac artery, inflated this to 12 atmospheres as well and some narrowing was seen with inflation.  Completion angiogram following this showed improved flow. Otherwise still a calcific lesion that caused some degree of stenosis but this now appeared to be less than 50% and I elected to terminate the procedure.  The sheath was pulled back to the ipsilateral external iliac artery and oblique arteriogram was performed.  StarClose closure device was deployed in the usual fashion with excellent hemostatic result.  The patient tolerated the procedure well and was taken to the recovery room in stable condition.      ___________________________ Annice NeedyJason S. Machel Violante, MD jsd:DT D: 06/30/2014 14:00:35 ET T: 06/30/2014 14:35:31 ET JOB#: [id number]  cc: Annice NeedyJason S. Dannel Rafter, MD, <Dictator> Annice NeedyJASON S Josafat Enrico MD ELECTRONICALLY SIGNED 07/22/2014 10:18

## 2015-02-01 NOTE — Discharge Summary (Signed)
PATIENT NAME:  [name], [name] MR#:  [id number] DATE OF BIRTH:  1928-03-27  DATE OF ADMISSION:  02/17/2012 DATE OF DISCHARGE:  02/21/2012  FINAL DIAGNOSES:  1. Rhabdomyolysis secondary to fall.  2. Fall secondary to unsteady gait and noncompliance with walker.  3. Recurrent constipation with patient falling while trying to get to the bathroom.  4. Diabetes mellitus, uncontrolled.  5. Hypertension.  6. Depression with anxiety.  7. Chronic obstructive pulmonary disease and chronic respiratory failure. Oxygen saturations here 94 to 95% on room air and not requiring home oxygen.  8. History of left eye blindness.  9. B12 deficiency.  10. Hyperlipidemia.  11. Internal hemorrhoids.  12. Dementia, progressive.  13. Degenerative disk disease.  14. Gastroesophageal reflux disease.  15. Osteoarthritis.  16. Coronary artery disease, medical management recommended.  17. Peripheral vascular disease.  18. Hypertension.  19. Hyperlipidemia.  20. Osteoarthritis.   HISTORY AND PHYSICAL: Please see dictated admission history and physical.   SUMMARY OF HOSPITAL COURSE: The patient was admitted after possible syncope, however, upon further investigation it appeared much more likely that she had a fall. The patient is normally supposed to ambulate with a walker but due to her dementia she will often forget to do this. She has had trouble with recurrent constipation, actually presented with liquid stool consistent with overflow incontinence. She was trying to get to the restroom when she fell. She was observed in her room to be up with a walker, at one point with her Depends around her ankles trying to get to the bathroom as well. An aggressive bowel regimen was undertaken and her bowels moved multiple times and it is hoped that with continued aggressive bowel regimen that we can decrease the bowel frequency overall and prevent her having a sense of urgency in trying to get to the bathroom.   She did have  evidence of mild rhabdomyolysis, was treated with IV fluids, and she tolerated this well. Her sugars have been uncontrolled at home mainly secondary to dietary noncompliance. Here her sugars actually were low and she required reduction in her insulin with reasonable control, however, this will need to be watched closely when she returns to her normal environment. To this end home health nursing will be arranged for the patient to try to help monitor this.   At this time she is discharged to assisted living facility in stable condition with her physical activity up with a walker as tolerated. She should be on a 2 gram sodium 1800 calorie ADA diet. Home health nursing and physical therapy will be ordered for the patient. Her sugars will be checked and recorded twice a day and should follow a sliding scale insulin. We will anticipate her following up in our office in the next two weeks.   DISCHARGE MEDICATIONS:  1. Neurontin 600 mg p.o. b.i.d. for diabetic neuropathy.  2. Aspirin 325 mg p.o. daily, enteric-coated.  3. Simvastatin 40 mg p.o. at bedtime.  4. Colace 100 mg p.o. b.i.d.  5. Advair 250/50 one puff b.i.d.  6. Omeprazole 20 mg p.o. daily.  7. Diovan 80 mg p.o. daily.  8. Sliding scale insulin.  9. Imdur 60 mg p.o. daily.  10. Flonase two sprays each nostril daily for allergic rhinitis.  11. Alprazolam 0.25 mg p.o. t.i.d. p.r.n. anxiety.  12. Diabetic Tussin 10 mL p.o. q.6 hours p.r.n. cough.  13. Combivent 3 puffs 4 times a day as needed for shortness of breath.  14. Tramadol 50 mg p.o. q.4 hours p.r.n.  severe pain.  15. Furosemide 40 mg p.o. daily.  16. Potassium 10 mEq p.o. daily. 17. Nicotine patch 21 mg topically daily x6 weeks which will need to removed if the patient starts smoking once again. After six weeks this can be reduced to 14 mg.  18. Insulin 70/30 20 units sub-Q in a.m., 8 units sub-Q in p.m.  19. MiraLAX 17 grams p.o. daily.   DO NOT TAKE: The patient given  instructions to stop Calcium and Vitamin D.   ____________________________ Lynnea Ferrier, MD bjk:drc D: 02/21/2012 08:05:48 ET T: 02/21/2012 15:29:09 ET JOB#: [id number]  cc: Sue Sites III, MD, <Dictator> [facility]  Sue Nones MD ELECTRONICALLY SIGNED 02/22/2012 7:30

## 2015-02-01 NOTE — Consult Note (Signed)
Comments   Met with pt's daughter, Franchot Heidelberg, and then with Thayer Headings from [facility]. Thayer Headings does not feel that pt can return to ALF and is recommending placement in SNF. CM aware and daughter in agreement.  spoke with daughter about code status. She and her sisters are all in agreement that pt be a DNR. Order entered and out-of-facility DNR completed and placed in chart. Vaughan Basta also spoke with her sisters about hospice at the facility but family does not yet feels pt needs this additional service.   Electronic Signatures: Taurus Alamo, Izora Gala (MD)  (Signed 24-May-13 12:43)  Authored: Palliative Care   Last Updated: 24-May-13 12:43 by Jenet Durio, Izora Gala (MD)

## 2015-02-01 NOTE — Consult Note (Signed)
Brief Consult Note: Diagnosis: bruising and pain right heel.   Consult note dictated.   Recommend further assessment or treatment.   Orders entered.   Comments: xrays right foot.  Will evaluate later.  Electronic Signatures: Epimenio Sarinroxler, Nadeen Shipman G (MD)  (Signed 27-May-13 09:21)  Authored: Brief Consult Note   Last Updated: 27-May-13 09:21 by Epimenio Sarinroxler, Spence Soberano G (MD)

## 2015-02-01 NOTE — Consult Note (Signed)
PATIENT NAME:  [name], FESTA MR#:  [id number] DATE OF BIRTH:  1928-04-23  DATE OF CONSULTATION:  03/05/2012  REFERRING PHYSICIAN:   CONSULTING PHYSICIAN:  Rhona Raider. Solomia Harrell, DPM  TIME OF CONSULTATION: 9:15 a.m.   HISTORY OF PRESENT ILLNESS: The patient is an 81 year old African American female who was admitted to the hospital for COPD and low oxygen saturation. While here she has complained of some pain and discomfort in her right heel. She states she fell a couple of weeks ago and she is uncertain if she injured it at that timeframe or if there are some other issues going on with it.   PAST MEDICAL HISTORY:  1. Hypertension.  2. Diabetes. 3. Coronary artery disease. 4. Peripheral artery disease, status post stenting. I'm not sure if that's for coronary artery, peripheral arteries, or both. 5. Chronic obstructive pulmonary disease. 6. Dementia. 7. Reflux. 8. Chronic renal disease.   SOCIAL HISTORY: Smokes a half a pack a day for many years. Denies EtOH.   FAMILY HISTORY: Positive for heart disease and diabetes.  PAST SURGICAL HISTORY:  1. Breast cystectomy. 2. Right arm fracture repair.  PHYSICAL EXAMINATION:   GENERAL: She is alert, seems well oriented. She is pleasant in no apparent distress at this timeframe.   HEENT: Within normal limits.   VITAL SIGNS: Temperature 96.7, pulse 70, respirations 18, blood pressure 126/66, and pulse oximetry is 95 at this juncture.   LOWER EXTREMITY EXAM:  VASCULATURE: DP/PT pulses are difficult to palpate. I palpated a very light pulse on her DP artery but it was difficult. She did have good capillary refill times, however, and good warmth to the skin of both feet.   DERMATOLOGIC: The patient has an area on her right heel where she has some hyperkeratotic build-up with apparent bruise underneath this region. It appears to be somewhat ecchymotic. Does not appear to be fluctuant at this point but it is a little difficult to tell about the  callus there as well. Two days ago she had some erythema along the medial aspect of the heel but that is better today. There is no open area, no sinuses, no drainage, no wounds at this point. It is mostly on the medial aspect of the heel.   ORTHOPEDIC: The patient has good range of motion metatarsal, metatarsophalangeal joints and has no gross deformities. It is tender to palpation over that region of the medial heel.  CLINICAL IMPRESSION:  This may represent a couple of things. She could have injured it when she fell and still has some residual ecchymosis. We need to check for any kind of underlying fracture or bone problems in the region. I will order x-rays for that today. There is also the possibility that she may have a pressure component here with some underlying tissue damage which I hope is not the case. She seems pretty stable. I did order some heel protector boots whenever she's in bed. She was getting a little pressure on it from sitting up in the chair so I put pillows underneath her feet today at this time. I don't think it needs a dressing at this point since there is no open area or drainage at all and no real evidence of an ulcer to the region.   TREATMENT PLAN:  1. Order x-rays and evaluate those. 2. Keep pressure off the heels. 3. I will see her again in follow-up after I get a look at these x-rays.   ____________________________ Rhona Raider Namiah Dunnavant, DPM mgt:drc D: 03/05/2012  09:19:44 ET T: 03/05/2012 12:07:24 ET JOB#: [id number] cc: Rhona RaiderMatthew G. Karyna Bessler, DPM, <Dictator> Epimenio SarinMATTHEW G Yan Pankratz MD ELECTRONICALLY SIGNED 03/20/2012 9:35

## 2015-02-01 NOTE — H&P (Signed)
PATIENT NAME:  [name], [name] MR#:  [id number] DATE OF BIRTH:  1928-05-28  DATE OF ADMISSION:  02/17/2012  PRIMARY CARE PHYSICIAN: Dr. Daniel Nones.   HISTORY OF PRESENT ILLNESS: The patient is an 81 year old African American female with past medical history significant for history of coronary artery disease, history of chronic obstructive pulmonary disease, chronic respiratory failure, questionable supposed to be on oxygen therapy, presented to the hospital with complaints of falls. According to the patient's medical records, apparently the patient was found outside on the ground when she went to smoke a cigarette. However, the patient numerous times claimed that she fell down in the room. She felt somewhat presyncopal, felt dizzy. She has been having problems with dizziness for the past two days. She was trying to go to the bathroom. She fell down and could not get up. She stated that she fell down on her side on the abdomen. She was noted to have right arm as well as right knee bruise. It happened at around 6:30 a.m. and the patient laid on the ground for approximately 30 minutes according to her recollections. She felt presyncopal, however, denied any syncope. She remembers falling. She remembered actually how she landed on the floor and she also states that she remembers how people came in and lifted her up. It is unclear if she was supposed to use oxygen in the facility or she refused this oxygen or she is not being given the oxygen because of her smoking history, but, apparently she was not on oxygen whenever they found her on the floor.   PAST MEDICAL HISTORY:   1. History of diabetes mellitus, insulin-dependent. 2. History of hypertension. 3. Depression.  4. History of chronic obstructive pulmonary disease with oxygen prescribed. However, the patient does not wear it.  5. Left eye blindness.  6. Borderline B12 deficiency.  7. Hyperlipidemia.  8. Hospitalized for gastroenteritis as well as  dehydration in March 2005. 9. Recurrent diarrhea, hospitalized in 2006 and fecal impaction with overflow incontinence as the source. Medication adjustment with MiraLAX and symptoms resolved.  10. Hospitalized in October 2008 for constipation leading to nausea as well as vomiting.  11. History of colonoscopy revealing only internal hemorrhoids.  12. History of dementia, progressive.  13. Degenerative disk disease with MRI in 2008 showing lumbar changes previously seen by orthopedics.  14. History of gastroesophageal reflux disease.  15. Left heel ulcer in 2010 followed by Dr. Graciela Husbands and  Dr. Tora Kindred.  16. History of coronary artery disease, admitted for subendocardial myocardial infarction in 2011. Cardiac catheterization revealed multivessel disease, four targets. Per cardiology, medical management only recommended. Seen by Dr. Darrold Junker on 01/17/2012 who felt that the patient had complex three-vessel coronary artery disease with ectatic proximal LAD stenosis. Chest pain with atypical features. History of echocardiogram done on 01/11/2012 which revealed mildly reduced left ventricular function with ejection fraction of 45%.  17. History of peripheral vascular disease with prior leg stent in the left proximal superficial femoral artery and left popliteal artery. 18. Hypertension.  19. Hyperlipidemia.   MEDICATIONS: According to medical records, the patient is on: 1. Advair Diskus 250/50 twice daily. 2. Alprazolam 0.25 mg 3 times daily. 3. Aspirin 325 mg p.o. daily. 4. Baza-Protect topical cream one application topically to the bottom daily as needed for irritation. 5. Combivent 3 puffs 4 times daily.  6. Dex4 assorted flavors, give if blood glucose levels are below 60, 10 mg orally. Repeat in 10 minutes as needed. 7. Diabetic tussin 10  mL every six hours as needed.  8. Diovan 80 mg p.o. daily.  9. Docusate sodium 100 mg p.o. twice daily.  10. Fluticasone two sprays to nasal cavities daily.   11. Furosemide 10 mg p.o. daily.  12. Gabapentin 600 mg p.o. twice daily.  13. Hydrocortisone AC one suppository twice daily as needed.  14. Isosorbide mononitrate 60 mg p.o. daily.  15. Novolin 70/30, 46 units in the morning and 16 units subcutaneously at bedtime.  16. Novolin R sliding scale.  17. Omeprazole 20 mg p.o. daily.  18. Oyster shell calcium, 1 tablet once daily.  19. MiraLAX 17 grams in liquid every second day around-the-clock.  20. Simvastatin 40 mg p.o. daily.  21. Tramadol 50 mg every four hours.  22. Triamcinolone 0.5% topical cream twice daily.  23. Vitamin D3 1000 units once daily.   ALLERGIES: ACE inhibitor which gives her cough.   PAST SURGICAL HISTORY:  1. Left breast cyst removal. 2. Right arm fracture requiring surgical repair.   SOCIAL HISTORY: Smokes approximately half pack a day. No alcohol abuse. Lives in assisted living facility. She has three children. She lives in [facility].   FAMILY HISTORY: Coronary artery disease, diabetes. The patient's sister had breast carcinoma. The patient's two brothers had lung carcinoma. The patient's grandmother had colon cancer.  REVIEW OF SYSTEMS: Positive for weakness, some blurring of vision for which she uses reading glasses, also cataract removal in the past. According to the patient's daughter, who is present during my interview, some dyspnea on exertion, nausea, vomiting yesterday as well as some diarrhea intermittently. Also, pains in the left shoulder. CONSTITUTIONAL: Otherwise, denies any fevers, chills, fatigue, weakness, pains, weight loss or gain. EYES: Denies double vision or glaucoma. ENT: Denies any tinnitus, allergies, epistaxis, sinus pain, dentures, or difficulty swallowing. RESPIRATORY: The patient denies any cough, wheeze, asthma or chronic obstructive pulmonary disease. CARDIOVASCULAR: Denies chest pains prior to fall. Denies any orthopnea, edema, arrhythmias, palpitations, felt presyncopal.  GASTROINTESTINAL: Denies abdominal pains, hematemesis, rectal bleeding, change in bowel habits. GENITOURINARY: Denies dysuria, hematuria, frequency, or incontinence. ENDOCRINOLOGY: Denies any polydipsia, nocturia, thyroid problems, heat or cold intolerance, or thirst. HEMATOLOGIC: Denies anemia, easy bruising, bleeding, or swollen glands. SKIN: Denies any acne, rashes, lesions, or change in moles. MUSCULOSKELETAL: Denies arthritis, cramps, swelling or gout. NEUROLOGIC: No numbness, epilepsy or tremor. PSYCHIATRY: Denies anxiety or insomnia.   PHYSICAL EXAMINATION:  VITAL SIGNS: On arrival to the hospital, temperature 96.5, pulse 79, respiration rate 20, blood pressure 132/67, saturation 92% on room air, but going down as low as 87% on room air whenever she exerts or just asleep.   GENERAL: This is a well-developed, obese African American female in moderate respiratory distress whenever she moves around and very weak, having difficulty when sitting up in the bed.   HEENT: Her pupils are equal and reactive to light. Extraocular movements are intact. No icterus or conjunctivitis. Has normal hearing. No pharyngeal erythema. Mucosa is dry.   NECK: Neck did not reveal any masses. Supple, nontender. Thyroid is not enlarged. No adenopathy. No JVD or carotid bruits bilaterally. Full range of motion.   LUNGS: Rales and crackles at the bases especially posteriorly, somewhat clear anteriorly. The patient does have labored inspirations, especially whenever she moves around, as well as increased effort to breathe. No dullness to percussion. Not in overt respiratory distress.   CARDIOVASCULAR: S1, S2 appreciated. No murmurs, rubs, or gallops were noted. Point of maximal impulse not lateralized. Chest nontender to palpation.  EXTREMITIES: 1+ pedal pulses. No lower extremity edema, calf tenderness or cyanosis.   ABDOMEN: Soft, nontender. Bowel sounds are present. No hepatosplenomegaly or masses were noted.    MUSCULOSKELETAL: The patient is able to move all four extremities. No cyanosis, degenerative joint disease, or kyphosis. Gait is not tested. Difficulty extending her arms due to left shoulder discomfort.   SKIN: Denies any rashes. The patient did have a few lesions, erosions in her right elbow as well as right knee. Some erythema of the left knee as well as minimal erosion on the left elbow.   LYMPH: No adenopathy in the cervical region.   NEUROLOGICAL: Cranial nerves grossly intact. Sensory is intact. No dysarthria or aphasia.   PSYCHIATRIC: The patient is alert, oriented to person, place, cooperative. Memory is somewhat impaired, but no significant confusion, agitation, or depression noted.   LABORATORY, DIAGNOSTIC, AND RADIOLOGICAL DATA: BMP showed BUN and creatinine of 29 and 1.52, otherwise unremarkable. The patient's potassium level is normal at 4.1, otherwise unremarkable. Liver enzymes showed AST elevation to 171. The patient's CK total was 8,110, MB fraction 29.7. Troponin 0.06. Urine drug screen was negative. CBC was within normal limits. Coagulation panel is unremarkable. Urinalysis: Yellow clear urine, 150 mg/dl glucose, negative for bilirubin, trace ketones, specific gravity 1.018, pH 5.0, 3+ blood, 30 mg/dl protein, negative for nitrites or leukocyte esterase, less than one red blood cell, two white blood cells, no bacteria, less than one epithelial cell. Mucous is present as well as 19 hyaline casts. EKG done in the Emergency Room showed normal sinus rhythm with sinus arrhythmia at 77 beats per minute, normal axis, nonspecific T wave abnormality. No change since prior EKG done in September 2011.   Pelvic AP 02/17/2012: No fracture seen. Right knee AP and lateral: No acute fracture. Left knee AP and lateral: No acute fracture. Abdomen three-way including PA of the chest showed stool throughout mildly distended colon which may be related to constipation, however, distal bowel obstruction  is not excluded. Lungs are clear. CT of head without contrast 02/17/2012 showed no evidence of acute intracranial hemorrhage, age-related changes, appears stable and not consistent with chronic small vessel ischemia. No evidence of acute skull fracture. Small cephalohematoma and laceration is present over posterior parietal bone on the left according to the radiologist.   ASSESSMENT AND PLAN:  1. Presyncope, questionable etiology. Admit the patient to medical floor for observation. Get ultrasound of carotids if it was not recently done. Echocardiogram was recently done in April so this will not be repeated. The patient will have also orthostatic vital signs checked to rule out orthostatic hypotension. We will also check oxygenation and will make decisions about oxygen therapy. She was instructed to use oxygen therapy on exertion. She may also would benefit from oxygen at bedtime.  2. Rhabdomyolysis after fall. Continue the patient on low rate IV fluids.  3. Constipation with some obstipation. We will continue outpatient medications and follow stool output.  4. Elevation of troponin with no chest pains on admission. We will check the patient's cardiac enzymes x3. Will continue outpatient medications.  5. Renal insufficiency, seemed to be stable from before and chronic.  6. Elevated transaminases, likely due to rhabdomyolysis.  7. History of coronary artery disease. As mentioned above, will continue outpatient medications.  8. History of chronic obstructive pulmonary disease. Will continue Advair as well as inhalation therapy.  9. History of hypertension. We will continue Diovan. The patient's blood pressure seemed to be satisfactorily controlled.  10. Hyperlipidemia.  Continue outpatient medications.    TIME SPENT: 50 minutes.   ____________________________ Katharina Caper, MD rv:ap D: 02/17/2012 14:49:50 ET T: 02/17/2012 16:38:03 ET JOB#: [id number]  cc: Katharina Caper, MD, <Dictator> Lynnea Ferrier, MD Jewett Mcgann MD ELECTRONICALLY SIGNED 02/18/2012 17:13

## 2015-02-01 NOTE — Op Note (Signed)
PATIENT NAME:  [name], Avanti MR#:  [id number] DATE OF BIRTH:  May 25, 1928  DATE OF PROCEDURE:  02/27/2012  PREOPERATIVE DIAGNOSES:  1. Peripheral arterial disease with ulceration, right lower extremity.  2. Hypertension.   POSTOPERATIVE DIAGNOSES:  1. Peripheral arterial disease with ulceration, right lower extremity. 2. Hypertension.  PROCEDURE PERFORMED:   1. Ultrasound guidance for vascular access, left femoral artery.  2. Catheter placement into right posterior tibial artery from left femoral approach.  3. Aortogram and selective right lower extremity angiogram.  4. Percutaneous transluminal angioplasty of right posterior tibial artery with 3 mm diameter angioplasty balloon.  5. Percutaneous transluminal angioplasty of right superficial femoral artery with 6 mm diameter angioplasty balloon.  6. Self-expanding stent placement to right superficial femoral artery for greater than 50% residual stenosis after angioplasty.  7. StarClose closure device, left femoral artery.   SURGEON: Festus BarrenJason Dew, MD   ANESTHESIA: Local with moderate conscious sedation.   ESTIMATED BLOOD LOSS: 25 mL.  FLUOROSCOPY TIME:  4.2 minutes.   CONTRAST USED: 15 mL.   INDICATION FOR PROCEDURE: The patient is an elderly female with dementia who has a right heel ulcer that is not healing. Her previous noninvasive studies were a couple of years ago but showed disease with some reduction in flow in that time, but she was not symptomatic enough to warrant intervention. At this point, she is brought back for further evaluation and  possible treatment for limb salvage. The risks and benefits were discussed. Informed consent was obtained.   DESCRIPTION OF PROCEDURE: The patient was brought to the Vascular Interventional Radiology Suite. Groins were shaved and prepped, and a sterile surgical field was created. The left femoral head was localized with fluoroscopy due to body habitus. Ultrasound was used to access a patent left  femoral artery without difficulty with a Seldinger needle. A J-wire was placed after skin nick and dilatation. A 5-French sheath was placed, and a pigtail catheter was placed at the aorta at the L1 level. AP aortogram was performed, and this showed normal renal vessels with no flow-limiting stenosis bilaterally. The aorta and iliac segments were patent without significant stenosis. I then hooked the aortic bifurcation and advanced to the right femoral head, and selective right lower extremity angiogram was then performed. This showed calcific disease in the proximal superficial femoral artery that was not flow limiting. In the mid to distal superficial femoral artery there was an 80 to 90% stenosis that was very calcific. She then had a normal tibial trifurcation. Her anterior tibial artery is diffusely diseased, although it did reconstitute distally. The posterior tibial artery was focally stenotic in the first 6 to 8 cm and then a decent vessel distally, and the peroneal artery was patent. To improve her overall flow, I placed a 6-French Ansel sheath over a Terumo Advantage Wire. The patient was heparinized. A 100 cm Kumpe catheter and an 0.018 wire were used to selectively cannulate the posterior tibial artery. I crossed the lesion without difficulty. The posterior tibial artery was treated with a 3 mm diameter angioplasty balloon with good angiographic completion result. I then treated the SFA with a 6 mm diameter angioplasty balloon with residual stenosis and dissection after angioplasty that was flow-limiting; and so I elected to place a self-expanding stent. The Cook 518, 7 mm diameter x 6 cm length, self-expanding stent was placed and ironed out with a 6 mm balloon with good angiographic completion result. At this point, the patient tolerated the procedure well. I elected to terminate  the procedure. The sheath was pulled back to the ipsilateral external iliac artery, and oblique arteriogram was performed.  The StarClose closure device was deployed in the usual fashion with excellent hemostatic result. The patient tolerated the procedure well and was taken to the recovery room in stable condition.  ____________________________ Annice Needy, MD jsd:cbb D: 02/27/2012 15:19:20 ET T: 02/27/2012 16:05:07 ET JOB#: [id number]  cc: Annice Needy, MD, <Dictator> Annice Needy MD ELECTRONICALLY SIGNED 02/28/2012 12:13

## 2015-02-01 NOTE — H&P (Signed)
PATIENT NAME:  [name], GUIN MR#:  [id number] DATE OF BIRTH:  [birth year]  DATE OF ADMISSION:  03/01/2012  PRIMARY CARE PHYSICIAN:  Dr. Daniel Nones  REFERRING PHYSICIAN:  Dr. Bevelyn Ngo   CHIEF COMPLAINT: Cough, wheezing, shortness of breath, and low oxygen saturation since yesterday.   HISTORY OF PRESENT ILLNESS:  The patient is an 81 year old African American female with a history of coronary artery disease, hypertension, diabetes, chronic obstructive pulmonary disease, and hyperlipidemia who was sent to the ED from assisted living for shortness of breath, wheezing, cough, and low O2 saturation at 88%. The patient is alert, awake, and oriented, in no acute distress. According to her and her daughter, the patient has had shortness of breath and wheezing for the past four days, but had very low oxygen saturation at 88% today so she was sent to the ED for further evaluation. The patient also has generalized weakness. She fell last week according to her daughter. Actually she was just discharged from the hospital for fall and rhabdomyolysis one week ago. The patient denies any headache or dizziness. No chest pain, palpitations, orthopnea, or nocturnal dyspnea, but has leg edema.   PAST MEDICAL HISTORY:  1. Hypertension.  2. Diabetes.  3. Coronary artery disease. 4. Peripheral arterial disease. 5. Status post stent. 6. Chronic obstructive pulmonary disease.  7. Dementia. 8. Gastroesophageal reflux disease. 9. Chronic kidney disease.   SOCIAL HISTORY:  She has smoked one-half pack a day for a long time. Denies any alcohol drinking or illicit drugs.   FAMILY HISTORY: Positive for coronary artery disease and diabetes.  The patient's sister had breast cancer. Two brothers with lung cancer. The patient's grandmother had colon cancer.  PAST SURGICAL HISTORY: 1. Left breast cyst removal.  2. Right arm fracture requiring surgical repair.   REVIEW OF SYSTEMS:  CONSTITUTIONAL: The patient denies any  fever or chills. No headache or dizziness but has generalized weakness. EYES: No double vision or blurred vision, but has right eye blindness. ENT: No postnasal drip, epistaxis, dysphagia, or slurred speech.  RESPIRATORY: Positive for cough, wheezing, shortness of breath, and sputum. No hemoptysis.  CARDIOVASCULAR: No chest pain, palpitations, orthopnea, or nocturnal dyspnea. Has leg edema. GASTROINTESTINAL: No abdominal pain, nausea, vomiting, or diarrhea. No bloody stool. No melena. GU:  No dysuria, hematuria, or incontinence. ENDOCRINE: No polyuria or polydipsia. No heat or cold intolerance. HEMATOLOGY: No easy bruising or bleeding. SKIN: No rash or jaundice. MUSCULOSKELETAL: No joint pain but has leg edema. NEURO: No syncope, loss of consciousness, or seizure.   PHYSICAL EXAMINATION:  VITALS: Temperature 96.8, blood pressure 125/64, pulse 63, respirations 15, oxygen saturation 100% on oxygen. The patient's blood pressure was low, 104/66, and was given normal saline.   GENERAL: The patient is weak. Alert, mildly confused, but in no acute distress. She is obese.   HEENT: Pupils round, equal, and reactive to light and accommodation. Moist oral mucosa. Clear oropharynx.   NECK: Supple. No JVD or carotid bruits. No lymphadenopathy. No thyromegaly.   CARDIOVASCULAR: S1, S2. Regular rate and rhythm. No murmurs or gallops.   PULMONARY:  Bilateral air entry. Bilateral mild to moderate wheezing. No crackles or rales.   ABDOMEN: Soft and obese. Bowel sounds present. No organomegaly. No distention or tenderness.   EXTREMITIES: Bilateral leg mild edema. No clubbing, or cyanosis. No calf tenderness. Right heel ulcer in dressing.  Bilateral pedal pulses exist.   NEUROLOGY: Alert and oriented times three. No focal deficit. Power five out of five. Sensation intact.  Deep tendon reflexes mute.   LABORATORY DATA: Urinalysis negative for urinary tract infection.  WBC 7.4, hemoglobin 11.3, platelets 224,  glucose 155, BUN 35, creatinine 2.08, which is increased compared to previous creatinine of 1.6 and 1.23 last week.  Troponin less than 0.02, sodium 140, potassium 4.2, chloride 103, bicarbonate 27, CK 211. CK-MB 1.2. EKG shows sinus rhythm at 79 beats per minute with marked sinus arrhythmias with T wave inversion from V4 to V6. Chest x-ray no infiltrate.    IMPRESSION:  1. Chronic obstructive pulmonary disease exacerbation.  2. Possible obstructive sleep apnea.  3. Acute renal failure on chronic kidney disease.  4. Coronary artery disease.  5. Diabetes.  6. Obesity.  7. Dementia. 8. Peripheral arterial disease.  9. Gastroesophageal reflux disease.  PLAN OF TREATMENT:  1. The patient will be admitted to the medical floor. We will start Solu-Medrol and give DuoNebs p.r.n. Continue Advair, Combivent, and start Zithromax.  2. Acute renal failure. We will hold Lasix and Diovan and give IV fluid. Follow up BMP.  3. For diabetes we will start sliding scale and continue on Novolin 70/30.  4. For hypertension, blood pressure is on the lower side. We will continue Imdur but hold Lasix and Diovan and continue aspirin.  5. PT evaluation.    ALLERGIES: None.   HOME MEDICATIONS: Please see the medication list.  I discussed the patient's situation and the plan of treatment with patient and the patient's daughter.       TIME SPENT: About 65 minutes.    ____________________________ Shaune PollackQing Anjenette Gerbino, MD qc:bjt D: 03/01/2012 05:26:36 ET T: 03/01/2012 11:34:31 ET JOB#: [id number]  cc: Shaune PollackQing Scarlet Abad, MD, <Dictator> Lynnea FerrierBert J. Klein III, MD Shaune PollackQING Bertrum Helmstetter MD ELECTRONICALLY SIGNED 03/02/2012 1:11

## 2015-02-01 NOTE — Discharge Summary (Signed)
PATIENT NAMETYKIA, [name] MR#:  [id number] DATE OF BIRTH:  07-May-1928  DATE OF ADMISSION:  03/01/2012 DATE OF DISCHARGE:  03/06/2012  FINAL DIAGNOSES:  1. Acute renal failure secondary to dehydration and recent IV contrast dye.  2. Acute exacerbation of chronic obstructive pulmonary disease with acute on chronic respiratory failure.  3. Hypertension.  4. New adult onset diabetes mellitus, uncontrolled.  5. Chronic kidney disease, stage III, secondary to diabetes.  6. Coronary artery disease.  7. Peripheral vascular disease, with recent angioplasty and stenting of the right femoral, on 02/27/2012. 8. Dementia, progressive.  9. Gastroesophageal reflux disease.  10. Ongoing tobacco abuse.   HISTORY AND PHYSICAL: Please see dictated admission history and physical.   [name]: The patient was admitted with renal failure, chronic obstructive pulmonary disease exacerbation, and acute on chronic respiratory failure. She was placed on IV fluids. Fortunately, renal function returned to baseline fairly quickly. She was placed on steroids, antibiotics, and inhaled medications with improvement in her breathing, and this returned to normal, saturations 95% on room air, on the day of discharge. She ambulated with physical therapy and made slow progress, but continued to have issues with balance and represented significant fall risk. She did complain of some pain in her right foot, and there was some concern about cellulitis. Podiatry saw the patient, as did vascular, and it looks like this is likely residual from her recent healed ulcer and peripheral vascular disease.   It was felt that she would not be able to return to her previous level of care as she was requiring more monitoring, and has had multiple recent admissions. She was seen by palliative care, and after discussion with the family members she was made DO NOT RESUSCITATE. At this time, she will be discharged to a nursing home  facility in stable condition with her physical activity to be up with a walker as tolerated. She will need to be on fall precautions. She will need fingerstick blood sugars before meals and at bedtime. She will need her feet elevated when in bed and with heel protectors in bed as well. Vital signs should be routine. Her diet will be no added salt, no concentrated sweets. Physical therapy and occupational therapy should evaluate and treat the patient and she will anticipate following up with Dr. Lucky Cowboy in the next 2 to 4 weeks. MET-B, CBC should be performed in one week with results to the nursing home physician.   DISCHARGE MEDICATIONS:  1. Neurontin 600 mg p.o. twice a day. 2. Simvastatin 40 mg p.o. at bedtime.  3. Advair 250/50 one puff twice a day. 4. Omeprazole 20 mg p.o. daily.  5. Diovan 80 mg p.o. daily. 6. MiraLax 17 grams p.o. every other day.  7. Novolin Regular sliding scale insulin.  8. Imdur 60 mg p.o. daily.  9. Xanax 0.25 mg p.o. three times daily p.r.n. anxiety.  10. Tramadol 50 mg p.o. every four hours p.r.n. severe pain.  11. Combivent Respimat one inhalation four times daily. 12. Colace 100 mg p.o. twice a day. 13. Flonase two sprays to each nostril daily.  14. Insulin 70/30, 40 units subcutaneous in a.m., 15 units subcutaneous in p.m.  15. Levaquin 500 mg p.o. daily x5 days to complete course.  16. Enteric-coated aspirin 81 mg p.o. daily.  17. Lasix 40 mg p.o. daily as needed for edema.  18. Potassium 10 mEq p.o. daily p.r.n. with Lasix.  19. Nicotrol one inhalation every 2 hours as needed for  nicotine cravings.  ____________________________ Adin Hector, MD bjk:slb D: 03/06/2012 12:38:26 ET T: 03/06/2012 12:57:21 ET JOB#: [id number]  cc: Adin Hector, MD, <Dictator> Algernon Huxley, MD Ramonita Lab MD ELECTRONICALLY SIGNED 03/07/2012 7:43

## 2015-03-30 ENCOUNTER — Other Ambulatory Visit: Payer: Self-pay

## 2015-03-30 ENCOUNTER — Emergency Department: Payer: Medicare Other

## 2015-03-30 ENCOUNTER — Encounter: Payer: Self-pay | Admitting: Emergency Medicine

## 2015-03-30 ENCOUNTER — Emergency Department
Admission: EM | Admit: 2015-03-30 | Discharge: 2015-03-30 | Disposition: A | Discharge status: Home / Self Care | Payer: Medicare Other | Attending: Emergency Medicine | Admitting: Emergency Medicine

## 2015-03-30 DIAGNOSIS — W1839XA Other fall on same level, initial encounter: Secondary | ICD-10-CM | POA: Diagnosis not present

## 2015-03-30 DIAGNOSIS — E119 Type 2 diabetes mellitus without complications: Secondary | ICD-10-CM | POA: Diagnosis not present

## 2015-03-30 DIAGNOSIS — S79912A Unspecified injury of left hip, initial encounter: Secondary | ICD-10-CM | POA: Diagnosis not present

## 2015-03-30 DIAGNOSIS — F0390 Unspecified dementia without behavioral disturbance: Secondary | ICD-10-CM | POA: Insufficient documentation

## 2015-03-30 DIAGNOSIS — K5900 Constipation, unspecified: Secondary | ICD-10-CM | POA: Insufficient documentation

## 2015-03-30 DIAGNOSIS — Y92129 Unspecified place in nursing home as the place of occurrence of the external cause: Secondary | ICD-10-CM | POA: Diagnosis not present

## 2015-03-30 DIAGNOSIS — Y9389 Activity, other specified: Secondary | ICD-10-CM | POA: Insufficient documentation

## 2015-03-30 DIAGNOSIS — M25552 Pain in left hip: Secondary | ICD-10-CM

## 2015-03-30 DIAGNOSIS — Z720 Tobacco use: Secondary | ICD-10-CM | POA: Insufficient documentation

## 2015-03-30 DIAGNOSIS — Y998 Other external cause status: Secondary | ICD-10-CM | POA: Diagnosis not present

## 2015-03-30 DIAGNOSIS — I10 Essential (primary) hypertension: Secondary | ICD-10-CM | POA: Insufficient documentation

## 2015-03-30 HISTORY — DX: Chronic obstructive pulmonary disease, unspecified: J44.9

## 2015-03-30 HISTORY — DX: Gastro-esophageal reflux disease without esophagitis: K21.9

## 2015-03-30 HISTORY — DX: Type 2 diabetes mellitus without complications: E11.9

## 2015-03-30 HISTORY — DX: Disorder of kidney and ureter, unspecified: N28.9

## 2015-03-30 HISTORY — DX: Essential (primary) hypertension: I10

## 2015-03-30 HISTORY — DX: Unspecified dementia, unspecified severity, without behavioral disturbance, psychotic disturbance, mood disturbance, and anxiety: F03.90

## 2015-03-30 LAB — GLUCOSE, CAPILLARY
Glucose-Capillary: 43 mg/dL — CL (ref 65–99)
Glucose-Capillary: 130 mg/dL — ABNORMAL HIGH (ref 65–99)

## 2015-03-30 MED ORDER — SIMETHICONE 80 MG PO CHEW: 80.0000 mg | CHEWABLE_TABLET | Freq: Four times a day (QID) | ORAL | Status: DC | PRN

## 2015-03-30 NOTE — ED Notes (Signed)
Sent from [facility], staff reports found pt in floor, pt states she fell.  Reports left ankle pain. And left side "feels funny.

## 2015-03-30 NOTE — ED Notes (Signed)
Checked glucose. Patient given lunch and juice.

## 2015-03-30 NOTE — Progress Notes (Signed)
   03/30/15 1600  Clinical Encounter Type  Visited With Patient and family together  Visit Type Initial  Spiritual Encounters  Spiritual Needs Prayer  Stress Factors  Patient Stress Factors Other (Comment) (Confused altered mental state symptoms)  Family Stress Factors Exhausted   Faith tradition: [employer] Status: confused in bed Family: 1 daughter at bedside Visit Assessment: Chaplain visited Ms. Folz as she was apparently in a state of confusion (loud outbursts, talking to the space) and oriented at times. Chaplain offered silent prayers and assisted the RN and the patient's daughter with wheel chair lift.  Chaplains and Pastoral Care can be reached at [phone] or by submitting an online request ([phone])

## 2015-03-30 NOTE — Discharge Instructions (Signed)
Constipation °Constipation is when a person has fewer than three bowel movements a week, has difficulty having a bowel movement, or has stools that are dry, hard, or larger than normal. As people grow older, constipation is more common. If you try to fix constipation with medicines that make you have a bowel movement (laxatives), the problem may get worse. Long-term laxative use may cause the muscles of the colon to become weak. A low-fiber diet, not taking in enough fluids, and taking certain medicines may make constipation worse.  °CAUSES  °· Certain medicines, such as antidepressants, pain medicine, iron supplements, antacids, and water pills.   °· Certain diseases, such as diabetes, irritable bowel syndrome (IBS), thyroid disease, or depression.   °· Not drinking enough water.   °· Not eating enough fiber-rich foods.   °· Stress or travel.   °· Lack of physical activity or exercise.   °· Ignoring the urge to have a bowel movement.   °· Using laxatives too much.   °SIGNS AND SYMPTOMS  °· Having fewer than three bowel movements a week.   °· Straining to have a bowel movement.   °· Having stools that are hard, dry, or larger than normal.   °· Feeling full or bloated.   °· Pain in the lower abdomen.   °· Not feeling relief after having a bowel movement.   °DIAGNOSIS  °Your health care provider will take a medical history and perform a physical exam. Further testing may be done for severe constipation. Some tests may include: °· A barium enema X-ray to examine your rectum, colon, and, sometimes, your small intestine.   °· A sigmoidoscopy to examine your lower colon.   °· A colonoscopy to examine your entire colon. °TREATMENT  °Treatment will depend on the severity of your constipation and what is causing it. Some dietary treatments include drinking more fluids and eating more fiber-rich foods. Lifestyle treatments may include regular exercise. If these diet and lifestyle recommendations do not help, your health care  provider may recommend taking over-the-counter laxative medicines to help you have bowel movements. Prescription medicines may be prescribed if over-the-counter medicines do not work.  °HOME CARE INSTRUCTIONS  °· Eat foods that have a lot of fiber, such as fruits, vegetables, whole grains, and beans. °· Limit foods high in fat and processed sugars, such as french fries, hamburgers, cookies, candies, and soda.   °· A fiber supplement may be added to your diet if you cannot get enough fiber from foods.   °· Drink enough fluids to keep your urine clear or pale yellow.   °· Exercise regularly or as directed by your health care provider.   °· Go to the restroom when you have the urge to go. Do not hold it.   °· Only take over-the-counter or prescription medicines as directed by your health care provider. Do not take other medicines for constipation without talking to your health care provider first.   °SEEK IMMEDIATE MEDICAL CARE IF:  °· You have bright red blood in your stool.   °· Your constipation lasts for more than 4 days or gets worse.   °· You have abdominal or rectal pain.   °· You have thin, pencil-like stools.   °· You have unexplained weight loss. °MAKE SURE YOU:  °· Understand these instructions. °· Will watch your condition. °· Will get help right away if you are not doing well or get worse. °Document Released: 06/24/2004 Document Revised: 10/01/2013 Document Reviewed: 07/08/2013 °ExitCare® Patient Information ©2015 [employer]. This information is not intended to replace advice given to you by your health care provider. Make sure you discuss any questions   you have with your health care provider.   X-rays of your chest abdomen and left hip were unremarkable today.

## 2015-03-30 NOTE — ED Notes (Signed)
Report called to [facility]. Awaiting EMS transport.

## 2015-03-30 NOTE — ED Provider Notes (Signed)
[facility] Emergency Department Provider Note  ____________________________________________  Time seen: 11:20 AM  I have reviewed the triage vital signs and the nursing notes.   HISTORY  Chief Complaint Fall    HPI [name] is a 81 y.o. female who is sent to the ED after being found on the floor in her room at her nursing home. The patient reports that she was in her usual state of health and feeling fine when she woke up this morning. She normally requires a walker to ambulate, but it was not neck to the bed when she woke up. She sat up in bed and attempted to get out of bed and walk to the [place] herself, but was unable to do so and fell down. She fell onto her left side and was lying on her left side until staff were able to assist her. She denies numbness tingling weakness headache syncope. No chest pain or shortness of breath. Denies abdominal pain. She notes some left hip pain.     Past Medical History  Diagnosis Date  . Dementia   . Hypertension   . Renal disorder   . Diabetes mellitus without complication   . COPD (chronic obstructive pulmonary disease)   . GERD (gastroesophageal reflux disease)     There are no active problems to display for this patient.   History reviewed. No pertinent past surgical history.  Current Outpatient Rx  Name  Route  Sig  Dispense  Refill  . simethicone (GAS-X) 80 MG chewable tablet   Oral   Chew 1 tablet (80 mg total) by mouth 4 (four) times daily as needed for flatulence.   100 tablet   2     Allergies Review of patient's allergies indicates no known allergies.  History reviewed. No pertinent family history.  Social History History  Substance Use Topics  . Smoking status: Current Every Day Smoker  . Smokeless tobacco: Not on file  . Alcohol Use: No    Review of Systems  Constitutional: No fever or chills. No weight changes Eyes:No blurry vision or double vision.  ENT: No sore  throat. Cardiovascular: No chest pain. Respiratory: No dyspnea or cough. Gastrointestinal: Negative for abdominal pain, vomiting and diarrhea.  No BRBPR or melena. Genitourinary: Negative for dysuria, urinary retention, bloody urine, or difficulty urinating. Musculoskeletal: Negative for back pain. No joint swelling or pain. Skin: Negative for rash. Neurological: Negative for headaches, focal weakness or numbness. Psychiatric:No anxiety or depression.   Endocrine:No hot/cold intolerance, changes in energy, or sleep difficulty.  10-point ROS otherwise negative.  ____________________________________________   PHYSICAL EXAM:  VITAL SIGNS: ED Triage Vitals  Enc Vitals Group     BP 03/30/15 1104 135/109 mmHg     Pulse Rate 03/30/15 1100 67     Resp 03/30/15 1100 20     Temp 03/30/15 1100 97.8 F (36.6 C)     Temp Source 03/30/15 1100 Oral     SpO2 03/30/15 1100 94 %     Weight 03/30/15 1100 205 lb (92.987 kg)     Height 03/30/15 1100 5\' 8"  (1.727 m)     Head Cir --      Peak Flow --      Pain Score 03/30/15 1102 3     Pain Loc --      Pain Edu? --      Excl. in GC? --      Constitutional: Alert and oriented to self and place. Well appearing and in  no distress. Eyes: No scleral icterus. No conjunctival pallor. PERRL. EOMI ENT   Head: Normocephalic and atraumatic.   Nose: No congestion/rhinnorhea. No septal hematoma   Mouth/Throat: MMM, no pharyngeal erythema. No peritonsillar mass. No uvula shift.   Neck: No stridor. No SubQ emphysema. No meningismus. Hematological/Lymphatic/Immunilogical: No cervical lymphadenopathy. Cardiovascular: RRR. Normal and symmetric distal pulses are present in all extremities. No murmurs, rubs, or gallops. Respiratory: Normal respiratory effort without tachypnea nor retractions. Breath sounds are clear and equal bilaterally. No wheezes/rales/rhonchi. Gastrointestinal: Soft and nontender. Markedly distended and tympanitic. There is  no CVA tenderness.  No rebound, rigidity, or guarding. Genitourinary: deferred Musculoskeletal: Mild tenderness over the left hip. Normal range of motion in all extremities. No joint effusions. No knee or ankle tenderness or swelling  No edema. Neurologic:   Normal speech and language.  CN 2-10 normal. Motor grossly intact. No pronator drift. No gross focal neurologic deficits are appreciated.  Skin:  Skin is warm, dry and intact. No rash noted.  No petechiae, purpura, or bullae. Psychiatric: Mood and affect are normal. Speech and behavior are normal. Patient exhibits appropriate insight and judgment.  ____________________________________________    LABS (pertinent positives/negatives) (all labs ordered are listed, but only abnormal results are displayed) Labs Reviewed - No data to display ____________________________________________   EKG  Interpreted by me Normal sinus rhythm rate of 61, leftward axis, normal intervals, normal QRS. Normal ST segments, T-wave inversions in anterior and lateral leads. This is unchanged compared to a prior EKG on 09/25/2014 ____________________________________________    RADIOLOGY  X-ray abdominal series interpreted by me, large intracolonic bowel gas without evidence of perforation or free air. No air-fluid levels to suggest obstruction. Radiology report reviewed X-ray left hip unremarkable.  ____________________________________________   PROCEDURES  ____________________________________________   INITIAL IMPRESSION / ASSESSMENT AND PLAN / ED COURSE  Pertinent labs & imaging results that were available during my care of the patient were reviewed by me and considered in my medical decision making (see chart for details).  Patient presents with mechanical fall after trying to get out of bed without using her walker. No acute medical complaints preceding the fall. Afterward, she complains of mild left hip pain but exam is reassuring and  x-ray is unremarkable. No evidence of obstruction or free air as a cause for her distended abdomen, and she does have large bowel gas demonstrated on x-ray. Very low suspicion for perforation abscess or other intra-abdominal pathology at this time. I'll add simethicone to her current GI regimen and discharge her home to follow up with primary care  ____________________________________________   FINAL CLINICAL IMPRESSION(S) / ED DIAGNOSES  Final diagnoses:  Hip pain, acute, left  Constipation, unspecified constipation type   mechanical fall due to noncompliance with assistive device    Sharman Cheek, MD 03/30/15 1301

## 2015-03-30 NOTE — ED Notes (Signed)
Patient and family do not want to wait for EMS chooses to leave POV. Patient loaded into car and [facility] and EMS notifed.

## 2016-01-21 ENCOUNTER — Emergency Department: Payer: Medicare Other

## 2016-01-21 ENCOUNTER — Inpatient Hospital Stay
Admission: EM | Admit: 2016-01-21 | Discharge: 2016-01-28 | DRG: 207 | Disposition: A | Discharge status: Hospice | Payer: Medicare Other | Attending: Internal Medicine | Admitting: Internal Medicine

## 2016-01-21 ENCOUNTER — Encounter: Payer: Self-pay | Admitting: Internal Medicine

## 2016-01-21 DIAGNOSIS — T17308A Unspecified foreign body in larynx causing other injury, initial encounter: Secondary | ICD-10-CM

## 2016-01-21 DIAGNOSIS — X58XXXA Exposure to other specified factors, initial encounter: Secondary | ICD-10-CM | POA: Diagnosis present

## 2016-01-21 DIAGNOSIS — R400 Somnolence: Secondary | ICD-10-CM | POA: Diagnosis not present

## 2016-01-21 DIAGNOSIS — R140 Abdominal distension (gaseous): Secondary | ICD-10-CM

## 2016-01-21 DIAGNOSIS — Z515 Encounter for palliative care: Secondary | ICD-10-CM | POA: Diagnosis not present

## 2016-01-21 DIAGNOSIS — Z66 Do not resuscitate: Secondary | ICD-10-CM | POA: Diagnosis not present

## 2016-01-21 DIAGNOSIS — Z7982 Long term (current) use of aspirin: Secondary | ICD-10-CM | POA: Diagnosis not present

## 2016-01-21 DIAGNOSIS — R402432 Glasgow coma scale score 3-8, at arrival to emergency department: Secondary | ICD-10-CM | POA: Diagnosis present

## 2016-01-21 DIAGNOSIS — I469 Cardiac arrest, cause unspecified: Secondary | ICD-10-CM | POA: Diagnosis present

## 2016-01-21 DIAGNOSIS — R404 Transient alteration of awareness: Secondary | ICD-10-CM | POA: Diagnosis not present

## 2016-01-21 DIAGNOSIS — Z6835 Body mass index (BMI) 35.0-35.9, adult: Secondary | ICD-10-CM

## 2016-01-21 DIAGNOSIS — F17200 Nicotine dependence, unspecified, uncomplicated: Secondary | ICD-10-CM | POA: Diagnosis present

## 2016-01-21 DIAGNOSIS — E1122 Type 2 diabetes mellitus with diabetic chronic kidney disease: Secondary | ICD-10-CM | POA: Diagnosis present

## 2016-01-21 DIAGNOSIS — E669 Obesity, unspecified: Secondary | ICD-10-CM | POA: Diagnosis present

## 2016-01-21 DIAGNOSIS — Z794 Long term (current) use of insulin: Secondary | ICD-10-CM | POA: Diagnosis not present

## 2016-01-21 DIAGNOSIS — G931 Anoxic brain damage, not elsewhere classified: Secondary | ICD-10-CM | POA: Diagnosis present

## 2016-01-21 DIAGNOSIS — N183 Chronic kidney disease, stage 3 (moderate): Secondary | ICD-10-CM | POA: Diagnosis present

## 2016-01-21 DIAGNOSIS — Z9911 Dependence on respirator [ventilator] status: Secondary | ICD-10-CM | POA: Diagnosis not present

## 2016-01-21 DIAGNOSIS — I468 Cardiac arrest due to other underlying condition: Secondary | ICD-10-CM | POA: Diagnosis present

## 2016-01-21 DIAGNOSIS — T17420A Food in trachea causing asphyxiation, initial encounter: Secondary | ICD-10-CM | POA: Diagnosis present

## 2016-01-21 DIAGNOSIS — I452 Bifascicular block: Secondary | ICD-10-CM | POA: Diagnosis present

## 2016-01-21 DIAGNOSIS — N179 Acute kidney failure, unspecified: Secondary | ICD-10-CM | POA: Diagnosis present

## 2016-01-21 DIAGNOSIS — Y92129 Unspecified place in nursing home as the place of occurrence of the external cause: Secondary | ICD-10-CM | POA: Diagnosis not present

## 2016-01-21 DIAGNOSIS — I1310 Hypertensive heart and chronic kidney disease without heart failure, with stage 1 through stage 4 chronic kidney disease, or unspecified chronic kidney disease: Secondary | ICD-10-CM | POA: Diagnosis present

## 2016-01-21 DIAGNOSIS — E872 Acidosis: Secondary | ICD-10-CM | POA: Diagnosis present

## 2016-01-21 DIAGNOSIS — K567 Ileus, unspecified: Secondary | ICD-10-CM

## 2016-01-21 DIAGNOSIS — I248 Other forms of acute ischemic heart disease: Secondary | ICD-10-CM | POA: Diagnosis present

## 2016-01-21 DIAGNOSIS — J9601 Acute respiratory failure with hypoxia: Secondary | ICD-10-CM | POA: Diagnosis not present

## 2016-01-21 DIAGNOSIS — F0390 Unspecified dementia without behavioral disturbance: Secondary | ICD-10-CM | POA: Diagnosis present

## 2016-01-21 DIAGNOSIS — R0681 Apnea, not elsewhere classified: Secondary | ICD-10-CM | POA: Diagnosis not present

## 2016-01-21 DIAGNOSIS — Z79899 Other long term (current) drug therapy: Secondary | ICD-10-CM

## 2016-01-21 DIAGNOSIS — J449 Chronic obstructive pulmonary disease, unspecified: Secondary | ICD-10-CM | POA: Diagnosis present

## 2016-01-21 DIAGNOSIS — J9690 Respiratory failure, unspecified, unspecified whether with hypoxia or hypercapnia: Secondary | ICD-10-CM

## 2016-01-21 DIAGNOSIS — R0600 Dyspnea, unspecified: Secondary | ICD-10-CM

## 2016-01-21 DIAGNOSIS — K219 Gastro-esophageal reflux disease without esophagitis: Secondary | ICD-10-CM | POA: Diagnosis present

## 2016-01-21 LAB — CBC
HEMATOCRIT: 34.6 % — AB (ref 35.0–47.0)
Hemoglobin: 11.4 g/dL — ABNORMAL LOW (ref 12.0–16.0)
MCH: 31.3 pg (ref 26.0–34.0)
MCHC: 33 g/dL (ref 32.0–36.0)
MCV: 94.8 fL (ref 80.0–100.0)
Platelets: 202 10*3/uL (ref 150–440)
RBC: 3.65 MIL/uL — ABNORMAL LOW (ref 3.80–5.20)
RDW: 14.5 % (ref 11.5–14.5)
WBC: 11.5 10*3/uL — ABNORMAL HIGH (ref 3.6–11.0)

## 2016-01-21 LAB — BLOOD GAS, ARTERIAL
ACID-BASE DEFICIT: 6.1 mmol/L — AB (ref 0.0–2.0)
ALLENS TEST (PASS/FAIL): POSITIVE — AB
Bicarbonate: 20.2 mEq/L — ABNORMAL LOW (ref 21.0–28.0)
FIO2: 0.5
Mechanical Rate: 14
O2 SAT: 97.2 %
PATIENT TEMPERATURE: 37
PCO2 ART: 42 mmHg (ref 32.0–48.0)
PEEP: 5 cmH2O
PH ART: 7.29 — AB (ref 7.350–7.450)
PO2 ART: 103 mmHg (ref 83.0–108.0)
VT: 500 mL

## 2016-01-21 LAB — COMPREHENSIVE METABOLIC PANEL
ALBUMIN: 3.4 g/dL — AB (ref 3.5–5.0)
ALK PHOS: 64 U/L (ref 38–126)
ALT: 18 U/L (ref 14–54)
AST: 41 U/L (ref 15–41)
Anion gap: 14 (ref 5–15)
BUN: 25 mg/dL — ABNORMAL HIGH (ref 6–20)
CALCIUM: 8.4 mg/dL — AB (ref 8.9–10.3)
CO2: 17 mmol/L — AB (ref 22–32)
CREATININE: 1.3 mg/dL — AB (ref 0.44–1.00)
Chloride: 106 mmol/L (ref 101–111)
GFR calc Af Amer: 42 mL/min — ABNORMAL LOW (ref >60)
GFR calc non Af Amer: 36 mL/min — ABNORMAL LOW (ref >60)
GLUCOSE: 305 mg/dL — AB (ref 65–99)
Potassium: 3.9 mmol/L (ref 3.5–5.1)
SODIUM: 137 mmol/L (ref 135–145)
Total Bilirubin: 0.3 mg/dL (ref 0.3–1.2)
Total Protein: 7.2 g/dL (ref 6.5–8.1)

## 2016-01-21 LAB — LACTIC ACID, PLASMA
Lactic Acid, Venous: 5.9 mmol/L (ref 0.5–2.0)
Lactic Acid, Venous: 4.7 mmol/L (ref 0.5–2.0)

## 2016-01-21 LAB — CBC WITH DIFFERENTIAL/PLATELET
BASOS ABS: 0 10*3/uL (ref 0–0.1)
Basophils Relative: 0 %
EOS ABS: 0.2 10*3/uL (ref 0–0.7)
Eosinophils Relative: 2 %
HCT: 36 % (ref 35.0–47.0)
HEMOGLOBIN: 11.5 g/dL — AB (ref 12.0–16.0)
LYMPHS PCT: 62 %
Lymphs Abs: 4.9 10*3/uL — ABNORMAL HIGH (ref 1.0–3.6)
MCH: 32 pg (ref 26.0–34.0)
MCHC: 31.9 g/dL — ABNORMAL LOW (ref 32.0–36.0)
MCV: 100.2 fL — ABNORMAL HIGH (ref 80.0–100.0)
MONO ABS: 0.2 10*3/uL (ref 0.2–0.9)
MONOS PCT: 3 %
MYELOCYTES: 3 %
Neutro Abs: 2.6 10*3/uL (ref 1.4–6.5)
Neutrophils Relative %: 30 %
Platelets: 196 10*3/uL (ref 150–440)
RBC: 3.59 MIL/uL — AB (ref 3.80–5.20)
RDW: 15.1 % — ABNORMAL HIGH (ref 11.5–14.5)
WBC: 7.9 10*3/uL (ref 3.6–11.0)
nRBC: 1 /100 WBC — ABNORMAL HIGH

## 2016-01-21 LAB — GLUCOSE, CAPILLARY
GLUCOSE-CAPILLARY: 224 mg/dL — AB (ref 65–99)
Glucose-Capillary: 162 mg/dL — ABNORMAL HIGH (ref 65–99)

## 2016-01-21 LAB — TROPONIN I
Troponin I: 0.03 ng/mL (ref <0.031)
Troponin I: 0.28 ng/mL — ABNORMAL HIGH (ref <0.031)

## 2016-01-21 LAB — CREATININE, SERUM
Creatinine, Ser: 1.07 mg/dL — ABNORMAL HIGH (ref 0.44–1.00)
GFR calc Af Amer: 53 mL/min — ABNORMAL LOW (ref >60)
GFR calc non Af Amer: 46 mL/min — ABNORMAL LOW (ref >60)

## 2016-01-21 MED ORDER — HEPARIN SODIUM (PORCINE) 5000 UNIT/ML IJ SOLN
5000.0000 [IU] | Freq: Three times a day (TID) | INTRAMUSCULAR | Status: DC
  Administered 2016-01-21 – 2016-01-26 (×14): 5000 [IU] via SUBCUTANEOUS
  Filled 2016-01-21 (×14): qty 1

## 2016-01-21 MED ORDER — ANTISEPTIC ORAL RINSE SOLUTION (CORINZ)
7.0000 mL | OROMUCOSAL | Status: DC
Start: 2016-01-22 — End: 2016-01-26
  Administered 2016-01-21 – 2016-01-26 (×41): 7 mL via OROMUCOSAL
  Filled 2016-01-21 (×54): qty 7

## 2016-01-21 MED ORDER — SODIUM CHLORIDE 0.9 % IV SOLN
INTRAVENOUS | Status: AC | PRN
  Administered 2016-01-21: 1000 mL via INTRAVENOUS

## 2016-01-21 MED ORDER — SODIUM CHLORIDE 0.9 % IV SOLN
INTRAVENOUS | Status: DC
  Administered 2016-01-21: 23:00:00 via INTRAVENOUS

## 2016-01-21 MED ORDER — FENTANYL CITRATE (PF) 100 MCG/2ML IJ SOLN: 50.0000 ug | INTRAMUSCULAR | Status: DC | PRN

## 2016-01-21 MED ORDER — CHLORHEXIDINE GLUCONATE 0.12% ORAL RINSE (MEDLINE KIT)
15.0000 mL | Freq: Two times a day (BID) | OROMUCOSAL | Status: DC
Start: 2016-01-21 — End: 2016-01-27
  Administered 2016-01-21 – 2016-01-26 (×11): 15 mL via OROMUCOSAL
  Filled 2016-01-21 (×14): qty 15

## 2016-01-21 MED ORDER — VANCOMYCIN HCL IN DEXTROSE 1-5 GM/200ML-% IV SOLN
1000.0000 mg | INTRAVENOUS | Status: DC
  Filled 2016-01-21: qty 200

## 2016-01-21 MED ORDER — ACETAMINOPHEN 325 MG PO TABS
650.0000 mg | ORAL_TABLET | Freq: Four times a day (QID) | ORAL | Status: DC | PRN
Start: 2016-01-21 — End: 2016-01-22

## 2016-01-21 MED ORDER — PIPERACILLIN-TAZOBACTAM 3.375 G IVPB
3.3750 g | Freq: Three times a day (TID) | INTRAVENOUS | Status: DC
  Administered 2016-01-22 – 2016-01-26 (×13): 3.375 g via INTRAVENOUS
  Filled 2016-01-21 (×16): qty 50

## 2016-01-21 MED ORDER — PANTOPRAZOLE SODIUM 40 MG IV SOLR
40.0000 mg | Freq: Every day | INTRAVENOUS | Status: DC
  Administered 2016-01-22: 40 mg via INTRAVENOUS
  Filled 2016-01-21: qty 40

## 2016-01-21 MED ORDER — INSULIN ASPART 100 UNIT/ML ~~LOC~~ SOLN
0.0000 [IU] | SUBCUTANEOUS | Status: DC
  Administered 2016-01-21: 2 [IU] via SUBCUTANEOUS
  Administered 2016-01-22 (×2): 3 [IU] via SUBCUTANEOUS
  Filled 2016-01-21 (×2): qty 3
  Filled 2016-01-21: qty 2

## 2016-01-21 MED ORDER — VANCOMYCIN HCL IN DEXTROSE 1-5 GM/200ML-% IV SOLN
1000.0000 mg | Freq: Once | INTRAVENOUS | Status: AC
  Administered 2016-01-21: 1000 mg via INTRAVENOUS
  Filled 2016-01-21: qty 200

## 2016-01-21 MED ORDER — ACETAMINOPHEN 650 MG RE SUPP
650.0000 mg | Freq: Four times a day (QID) | RECTAL | Status: DC | PRN
Start: 2016-01-21 — End: 2016-01-22

## 2016-01-21 MED ORDER — INSULIN ASPART 100 UNIT/ML ~~LOC~~ SOLN: 0.0000 [IU] | Freq: Three times a day (TID) | SUBCUTANEOUS | Status: DC

## 2016-01-21 MED ORDER — PIPERACILLIN-TAZOBACTAM 3.375 G IVPB 30 MIN
3.3750 g | Freq: Once | INTRAVENOUS | Status: AC
  Administered 2016-01-21: 3.375 g via INTRAVENOUS
  Filled 2016-01-21: qty 50

## 2016-01-21 NOTE — Progress Notes (Signed)
eLink Physician-Brief Progress Note Patient Name: [name] DOB: April 09, 1928 MRN: [id number]   Date of Service  01/21/2016  HPI/Events of Note  ED calls re: new ICU admit.  14F, known DM,CKD,CAD, previously DNR in 2015, found unresponsive. PEA/asystole arrest. Revived p 15 minutes. BP 100 sys after arrest, Intubated at ED > chunks of meat aspirated per ET.   Cranial CT (-). CXR > RUL infiltrate. CT abd > air in sigmoid colon.  WBC 8. Creat 1.3. ABG on 50% 7.29/42/103.   BP 100 sys. Intubated. Not on pressors.   ED said he spoke with family re: code status. Full code for now.    Admit to icu.  pccm to consult for now Suggest normothermia therapeutic management Keep intubated. Panculture. Treat for asp pna. Check lactic acid. Monitor hco3 --may need hco3 drip 2/2     acidosis Need to address code status in am.     Previously was a DNR Cont IVF. Not on pressors. Dr. Sung AmabileSimonds aware of admission      Intervention Category Evaluation Type: New Patient Evaluation  [name] 01/21/2016, 9:01 PM

## 2016-01-21 NOTE — ED Notes (Addendum)
Pt arrived via EMS from [facility] post cardiac arrest. Pt had meat caught in throat. Pt in cardiac arrest on EMS arrival. EMS began chest compressions. EMS established IO access left leg. EMS administered epinephrine 1mg  x 3. EMS reports pt initial rhythm asystole. Pt then went into PEA. Pt converted to asystole. Pt then converted to sinus tachycardia. [facility] EMS 203/103, CBG 151.

## 2016-01-21 NOTE — Progress Notes (Signed)
Pharmacy Antibiotic Note  Sue CraverSelena Gates is a 82 y.o. female admitted on 01/21/2016 with pneumonia.  Pharmacy has been consulted for Zosyn and vancomycin dosing.  Plan: 1. Zosyn 3.375 gm IV Q8H EI 2. Vancomycin 1 gm IV Q24H with stacked dosing, second dose approximately 11 hours after first, predicted trough 18 mcg/mL. Pharmacy will continue to follow and adjust as needed to maintain trough 15 to 20 mcg/mL.  Vd 47.2 L, Ke 0.032 hr-1, T1/2 21.4 hr  Height: 5\' 3"  (160 cm) Weight: 200 lb (90.719 kg) IBW/kg (Calculated) : 52.4  Temp (24hrs), Avg:96.4 F (35.8 C), Min:95.8 F (35.4 C), Max:97.4 F (36.3 C)   Recent Labs Lab 01/21/16 1859 01/21/16 2109  WBC 7.9  --   CREATININE 1.30*  --   LATICACIDVEN  --  5.9*    Estimated Creatinine Clearance: 33.8 mL/min (by C-G formula based on Cr of 1.3).    No Known Allergies  Thank you for allowing pharmacy to be a part of this patient's care.  Sue FrostNathan A Anetria Gates, Pharm.D., BCPS Clinical Pharmacist 01/21/2016 10:22 PM

## 2016-01-21 NOTE — Progress Notes (Signed)
eLink Physician-Brief Progress Note Patient Name: Sue CraverSelena Gates DOB: [birth year] MRN: [id number]   Date of Service  01/21/2016  HPI/Events of Note  Best Practice Sedation  eICU Interventions  SCDs for DVT propy in addition to subq heparin PPI for stress ulcer propy while intubated PRN sedation with fentanyl     Intervention Category Intermediate Interventions: Best-practice therapies (e.g. DVT, beta blocker, etc.)  [name] 01/21/2016, 11:39 PM

## 2016-01-21 NOTE — ED Notes (Signed)
Urine sent to lab 

## 2016-01-21 NOTE — Progress Notes (Signed)
Pt transported to CT and back to ER 1, without incident. Pt remained on vent.

## 2016-01-21 NOTE — Progress Notes (Signed)
°   01/21/16 2317  Clinical Encounter Type  Visited With Patient and family together  Visit Type Initial  Referral From Nurse  Consult/Referral To Chaplain  Spiritual Encounters  Spiritual Needs Prayer  Stress Factors  Family Stress Factors Major life changes  Advance Directives (For Healthcare)  Does patient have an advance directive? No  Would patient like information on creating an advanced directive? No - patient declined information  Pastoral care visit and prayer with patient and family members. Chaplain [employer]elyn Crews Ext [phone]

## 2016-01-21 NOTE — ED Provider Notes (Signed)
Time Seen: Approximately 1910 I have reviewed the triage notes  Chief Complaint: Post Arrest    History of Present Illness: [name] is a 82 y.o. female who is at [facility] health care tonight where EMS was notified about a cardiac arrest. In the process of resuscitation the patient went from asystole to PDA back to asystole and then was able to obtain a normal sinus rhythm after third round of epinephrine and 15 minutes of chest compressions. The patient had did not have any evidence of a living will and DO NOT RESUSCITATE. Patient's blood glucose level was 151. Patient arrives with systolic pressure of 80. She currently has a King airway in place. There was one attempt at oral intubation the field and a large piece of meat was removed from the upper airway. We are not aware if the patient had any pre-code complaints. The patient has return of spontaneous circulation and phone contact was made with the intensivist to see if the patient's hyperthermia candidate.   Past Medical History  Diagnosis Date  . Dementia   . Hypertension   . Renal disorder   . Diabetes mellitus without complication   . COPD (chronic obstructive pulmonary disease)   . GERD (gastroesophageal reflux disease)     There are no active problems to display for this patient.   No past surgical history on file.  No past surgical history on file.  Current Outpatient Rx  Name  Route  Sig  Dispense  Refill  . albuterol (PROVENTIL) (2.5 MG/3ML) 0.083% nebulizer solution   Nebulization   Take 2.5 mg by nebulization every 4 (four) hours as needed for wheezing or shortness of breath.         Marland Kitchen alum & mag hydroxide-simeth (MINTOX) 200-200-20 MG/5ML suspension   Oral   Take 10 mLs by mouth every 8 (eight) hours as needed for indigestion or heartburn.         Marland Kitchen aspirin EC 81 MG tablet   Oral   Take 81 mg by mouth daily with breakfast.         . bisacodyl (DULCOLAX) 5 MG EC tablet   Oral   Take 5 mg by mouth  daily as needed for mild constipation, moderate constipation or severe constipation.         Marland Kitchen dextrose (GLUTOSE) 40 % GEL   Oral   Take 1 Tube by mouth as needed for low blood sugar.         . divalproex (DEPAKOTE SPRINKLE) 125 MG capsule   Oral   Take 250 mg by mouth 2 (two) times daily.         Marland Kitchen docusate sodium (COLACE) 100 MG capsule   Oral   Take 100 mg by mouth 2 (two) times daily.         . fluticasone (FLONASE) 50 MCG/ACT nasal spray   Each Nare   Place 2 sprays into both nostrils daily. For allergies         . fluticasone furoate-vilanterol (BREO ELLIPTA) 100-25 MCG/INH AEPB   Inhalation   Inhale 1 puff into the lungs daily.         Marland Kitchen gabapentin (NEURONTIN) 300 MG capsule   Oral   Take 300 mg by mouth every 12 (twelve) hours.         Marland Kitchen HYDROcodone-acetaminophen (NORCO/VICODIN) 5-325 MG tablet   Oral   Take 1 tablet by mouth every 4 (four) hours as needed for severe pain.         Marland Kitchen  insulin detemir (LEVEMIR) 100 UNIT/ML injection   Subcutaneous   Inject 30 Units into the skin at bedtime.         . insulin regular (NOVOLIN R,HUMULIN R) 100 units/mL injection   Subcutaneous   Inject 12 Units into the skin 2 (two) times daily.         . insulin regular (NOVOLIN R,HUMULIN R) 100 units/mL injection   Subcutaneous   Inject 4 Units into the skin daily.         . isosorbide mononitrate (IMDUR) 60 MG 24 hr tablet   Oral   Take 60 mg by mouth daily.         Marland Kitchen losartan (COZAAR) 50 MG tablet   Oral   Take 100 mg by mouth at bedtime.         Marland Kitchen omeprazole (PRILOSEC) 20 MG capsule   Oral   Take 20 mg by mouth every morning.         . polyethylene glycol (MIRALAX / GLYCOLAX) packet   Oral   Take 17 g by mouth 2 (two) times daily.         . simethicone (GAS-X) 80 MG chewable tablet   Oral   Chew 1 tablet (80 mg total) by mouth 4 (four) times daily as needed for flatulence.   100 tablet   2   . trolamine salicylate (ASPERCREME) 10 %  cream   Topical   Apply 1 application topically at bedtime as needed for muscle pain.         Marland Kitchen umeclidinium bromide (INCRUSE ELLIPTA) 62.5 MCG/INH AEPB   Inhalation   Inhale 1 puff into the lungs daily.           Allergies:  Review of patient's allergies indicates no known allergies.  Family History: No family history on file.  Social History: Social History  Substance Use Topics  . Smoking status: Current Every Day Smoker  . Smokeless tobacco: Not on file  . Alcohol Use: No     Review of Systems:   The patient is unresponsive comatose with a Glasgow Coma Scale of 3 and unable to offer any history or review of systems*  Physical Exam:  ED Triage Vitals  Enc Vitals Group     BP 01/21/16 1910 102/65 mmHg     Pulse Rate 01/21/16 1910 73     Resp 01/21/16 1957 18     Temp 01/21/16 1957 97.4 F (36.3 C)     Temp Source 01/21/16 1957 Oral     SpO2 01/21/16 1910 100 %     Weight --      Height --      Head Cir --      Peak Flow --      Pain Score --      Pain Loc --      Pain Edu? --      Excl. in GC? --     General: GCS of 3. Patient did have some spontaneous respirations in the process of airway manipulation Head: Normal cephalic , atraumatic Eyes: Pupils equal , round, reactive to light Nose/Throat: No nasal drainage, patent upper airway without erythema or exudate.  Neck: Supple, Full range of motion, No anterior adenopathy or palpable thyroid masses Lungs: Clear to ascultation without wheezes , rhonchi, or rales Heart: Regular rate, regular rhythm without murmurs , gallops , or rubs Abdomen: Abdomen is distended bowel sounds diminished no palpable masses      Extremities: 2 plus  symmetric pulses. No edema, clubbing or cyanosis Neurologic: normal ambulation, Motor symmetric without deficits, sensory intact Skin: warm, dry, no rashes   Labs:   All laboratory work was reviewed including any pertinent negatives or positives listed below:  Labs Reviewed   CBC WITH DIFFERENTIAL/PLATELET - Abnormal; Notable for the following:    RBC 3.59 (*)    Hemoglobin 11.5 (*)    MCV 100.2 (*)    MCHC 31.9 (*)    RDW 15.1 (*)    All other components within normal limits  COMPREHENSIVE METABOLIC PANEL - Abnormal; Notable for the following:    CO2 17 (*)    Glucose, Bld 305 (*)    BUN 25 (*)    Creatinine, Ser 1.30 (*)    Calcium 8.4 (*)    Albumin 3.4 (*)    GFR calc non Af Amer 36 (*)    GFR calc Af Amer 42 (*)    All other components within normal limits  GLUCOSE, CAPILLARY - Abnormal; Notable for the following:    Glucose-Capillary 224 (*)    All other components within normal limits  TROPONIN I  LACTIC ACID, PLASMA  LACTIC ACID, PLASMA  BLOOD GAS, ARTERIAL    EKG: * ED ECG REPORT I, Jennye Moccasin, the attending physician, personally viewed and interpreted this ECG.  Date: 01/21/2016 EKG Time: 1900 Rate: 91 Rhythm: normal sinus rhythm QRS Axis: normal Intervals: Right bundle-branch block and left anterior fascicular block pattern ST/T Wave abnormalities: ST elevation noticed in aVR with ST depression in V2 Conduction Disturbances: none Narrative Interpretation: unremarkable No old EKG immediately available for comparison   Radiology:  EXAM: CT HEAD WITHOUT CONTRAST  TECHNIQUE: Contiguous axial images were obtained from the base of the skull through the vertex without intravenous contrast.  COMPARISON: CT of the head performed 09/25/2014  FINDINGS: There is no evidence of acute infarction, mass lesion, or intra- or extra-axial hemorrhage on CT.  Prominence of the ventricles and sulci reflects mild to moderate cortical volume loss. Cerebellar atrophy is noted. Scattered periventricular and subcortical white matter change likely reflects small vessel ischemic microangiopathy. Chronic lacunar infarcts are noted at the basal ganglia bilaterally and at the right thalamus.  The brainstem and fourth ventricle are within  normal limits. The basal ganglia are unremarkable in appearance. The cerebral hemispheres demonstrate grossly normal gray-white differentiation. No mass effect or midline shift is seen.  There is no evidence of fracture; visualized osseous structures are unremarkable in appearance. The orbits are within normal limits. The paranasal sinuses and mastoid air cells are well-aerated. No significant soft tissue abnormalities are seen.  IMPRESSION: 1. No acute intracranial pathology seen on CT. 2. Mild to moderate cortical volume loss and scattered small vessel ischemic microangiopathy. 3. Chronic lacunar infarcts at the basal ganglia bilaterally, and at the right thalamus.   Electronically Signed By: Roanna Raider M.D. On: 01/21/2016 20:11          CT RENAL STONE STUDY (Final result) Result time: 01/21/16 20:30:46   Final result by Rad Results In Interface (01/21/16 20:30:46)   Narrative:   CLINICAL DATA: Post cardiac arrest.  EXAM: CT ABDOMEN AND PELVIS WITHOUT CONTRAST  TECHNIQUE: Multidetector CT imaging of the abdomen and pelvis was performed following the standard protocol without IV contrast.  COMPARISON: 06/12/2010  FINDINGS: Mild hazy bibasilar opacification likely atelectasis. Calcified plaque over the right coronary artery. There are minimally displaced acute fractures of a lower anterior rib bilaterally seen on the most superior image.  Abdominal images demonstrate a nasogastric tube with tip within the distal stomach. The liver, spleen, pancreas, gallbladder and adrenal glands are within normal.  Kidneys are normal in size without hydronephrosis or nephrolithiasis. Small bilateral renal cysts are present. Ureters are within normal.  Appendix is normal. Mild fecal retention throughout the colon as there is redundancy and dilatation of the air-filled sigmoid colon measuring 12 cm in diameter although no evidence of a volvulus. Small bowel is  within normal.  There is moderate calcified plaque over the abdominal aorta and iliac arteries per  Pelvic images demonstrate the uterus, ovaries and rectum to be within normal. There is a small caliber catheter within the bladder with small collection of air within the bladder.  Mild degenerative changes of the spinal multilevel disc disease over the lumbar spine. Mild loss of mid vertebral body height of L1 age indeterminate. Mild degenerate change of the hips.  IMPRESSION: Minimally displaced acute rib fractures over the anterior ribs bilaterally on the most superior image. Mild posterior basilar atelectasis.  Air-filled dilated and redundant sigmoid colon measuring 12 cm in diameter without evidence of volvulus. Likely ileus. Recommend follow-up as clinically indicated.  Bilateral renal cysts.  Tubes and lines as described.  Mild loss of mid vertebral body height of L1 age indeterminate.   Electronically Signed By: Elberta Fortis M.D. On: 01/21/2016 20:30          DG Chest Port 1 View (Final result) Result time: 01/21/16 19:48:45   Final result by Rad Results In Interface (01/21/16 19:48:45)   Narrative:   CLINICAL DATA: Asystole  EXAM: PORTABLE CHEST 1 VIEW  COMPARISON: 03/30/2015  FINDINGS: Endotracheal tube tip between the clavicular heads and carina. An orogastric tube reaches the stomach.  Perihilar interstitial coarsening. Low volume chest. Stable normal heart size and negative mediastinal contours when compared to prior. No evidence of effusion or air leak. No visible fracture.  IMPRESSION: 1. Unremarkable positioning of endotracheal and orogastric tubes. 2. Interstitial opacities which could reflect edema or aspiration.          I personally reviewed the radiologic studies   Procedures Patient had stabilization of her airway by changing out the [facility] airway. The large King airway balloon was let down the patient was  orotracheally intubated with a 7.5 endotracheal tube with a curved blade with good visualization of the vocal cords. The patient had positive CO2 change and the endotracheal tube was taped in place with chest x-ray pending. Patient had some spontaneous respirations apneic in nature in the process of intubation but did not require any paralytics or sedatives  Patient had insertion of a nasogastric tube by myself at the head of the bed. Positive bowel sounds present. Tube appears to be in the correct position   Critical Care: *  CRITICAL CARE Performed by: Jennye Moccasin   Total critical care time: 73 minutes  Critical care time was exclusive of separately billable procedures and treating other patients.  Critical care was necessary to treat or prevent imminent or life-threatening deterioration.  Critical care was time spent personally by me on the following activities: development of treatment plan with patient and/or surrogate as well as nursing, discussions with consultants, evaluation of patient's response to treatment, examination of patient, obtaining history from patient or surrogate, ordering and performing treatments and interventions, ordering and review of laboratory studies, ordering and review of radiographic studies, pulse oximetry and re-evaluation of patient's condition. Resuscitation post code.  ED Course:  Patient was placed on  a ventilator with normal settings. Patient had an NG tube and Foley catheter established and then had her chest x-ray, head CT, abdominal CT. Patient had a lot of abdominal distention and appears to have a significant ileus. Is felt the patient likely went into asystole from a choking spell from a large piece of meat that was lodged in her airway and we cannot find any evidence of other pathology at this time. The patient's E[facility] on [facility] and we both feel that doesn't meet STEMI criteria and the changes are most  likely post ischemia from arrest. The patient's case was reviewed with the intensivist who are still trying to decide on whether or not the patient should be  hypothermia protocol. Patient's case was also reviewed with the hospitalist and she'll be admitted to the intensive care unit continued on ventilator settings etc. The pressure and heart rate is stabilized post resuscitation. I spoken to the family and updated them and currently family members are intermittently visiting at the bedside and they are aware of the patient's poor prognosis    Final Clinical Impression:   Final diagnoses:  Abdominal distension  Cardiac arrest (HCC)  Choking, initial encounter     Plan: * Intensive care unit management            Jennye MoccasinBrian S Quigley, MD 01/21/16 2104

## 2016-01-21 NOTE — H&P (Signed)
[facility] - [place] at [facility]   PATIENT NAME: [name]    MR#:  [id number]  DATE OF BIRTH:  Dec 12, 1927  DATE OF ADMISSION:  01/21/2016  PRIMARY CARE PHYSICIAN: No PCP Per Patient   REQUESTING/REFERRING PHYSICIAN: Dr. Huel Cote  CHIEF COMPLAINT:   STATUS post cardiac arrest  HISTORY OF PRESENT ILLNESS:  [name]  is a 82 y.o. female with a known history of of COPD, dementia, diabetes, CKD-III, GERD who is a [facility] comes to the emergency room after she had a cardiac arrest. EMS was notified. Patient was found to be in asystole received epinephrine and chest compressions for about 15 minutes. A King airway was placed. Patient was brought to the emergency room. Oral intubation was attempted in the field and large piece of meat was removed from the upper airway. Patient was successfully intubated in the emergency room and large of yellow thick secretions with food material was suctioned. She is currently not requiring any sedation. She is receiving IV fluids and antibiotics have been ordered for possible aspiration. She is hemodynamically stable at present blood pressure is 125/76 heart rate is 77 during my evaluation. Her temperature was 95 7. She is going to be placed on warming blanket. Spoke with ICU on-call and for now patient is not going to be placed on hypothermia protocol. Lactic acid is 5.9. Family is at bedside. Patient is being admitted with acute hypoxic respiratory failure status post cardiac arrest status post aspiration  PAST MEDICAL HISTORY:   Past Medical History  Diagnosis Date  . Dementia   . Hypertension   . Renal disorder   . Diabetes mellitus without complication (HCC)   . COPD (chronic obstructive pulmonary disease) (HCC)   . GERD (gastroesophageal reflux disease)     PAST SURGICAL HISTOIRY:  No past surgical history on file.  SOCIAL HISTORY:   Social History  Substance Use Topics  . Smoking  status: Current Every Day Smoker  . Smokeless tobacco: Not on file  . Alcohol Use: No    FAMILY HISTORY:  No family history on file.  DRUG ALLERGIES:  No Known Allergies  REVIEW OF SYSTEMS:  Review of Systems  Unable to perform ROS: intubated     MEDICATIONS AT HOME:   Prior to Admission medications   Medication Sig Start Date End Date Taking? Authorizing Provider  albuterol (PROVENTIL) (2.5 MG/3ML) 0.083% nebulizer solution Take 2.5 mg by nebulization every 4 (four) hours as needed for wheezing or shortness of breath.   Yes Historical Provider, MD  alum & mag hydroxide-simeth (MINTOX) 200-200-20 MG/5ML suspension Take 10 mLs by mouth every 8 (eight) hours as needed for indigestion or heartburn.   Yes Historical Provider, MD  aspirin EC 81 MG tablet Take 81 mg by mouth daily with breakfast.   Yes Historical Provider, MD  bisacodyl (DULCOLAX) 5 MG EC tablet Take 5 mg by mouth daily as needed for mild constipation, moderate constipation or severe constipation.   Yes Historical Provider, MD  dextrose (GLUTOSE) 40 % GEL Take 1 Tube by mouth as needed for low blood sugar.   Yes Historical Provider, MD  divalproex (DEPAKOTE SPRINKLE) 125 MG capsule Take 250 mg by mouth 2 (two) times daily.   Yes Historical Provider, MD  docusate sodium (COLACE) 100 MG capsule Take 100 mg by mouth 2 (two) times daily.   Yes Historical Provider, MD  fluticasone (FLONASE) 50 MCG/ACT nasal spray Place 2 sprays into both nostrils  daily. For allergies   Yes Historical Provider, MD  fluticasone furoate-vilanterol (BREO ELLIPTA) 100-25 MCG/INH AEPB Inhale 1 puff into the lungs daily.   Yes Historical Provider, MD  gabapentin (NEURONTIN) 300 MG capsule Take 300 mg by mouth every 12 (twelve) hours.   Yes Historical Provider, MD  HYDROcodone-acetaminophen (NORCO/VICODIN) 5-325 MG tablet Take 1 tablet by mouth every 4 (four) hours as needed for severe pain.   Yes Historical Provider, MD  insulin detemir (LEVEMIR) 100  UNIT/ML injection Inject 30 Units into the skin at bedtime.   Yes Historical Provider, MD  insulin regular (NOVOLIN R,HUMULIN R) 100 units/mL injection Inject 12 Units into the skin 2 (two) times daily.   Yes Historical Provider, MD  insulin regular (NOVOLIN R,HUMULIN R) 100 units/mL injection Inject 4 Units into the skin daily.   Yes Historical Provider, MD  isosorbide mononitrate (IMDUR) 60 MG 24 hr tablet Take 60 mg by mouth daily.   Yes Historical Provider, MD  losartan (COZAAR) 50 MG tablet Take 100 mg by mouth at bedtime.   Yes Historical Provider, MD  omeprazole (PRILOSEC) 20 MG capsule Take 20 mg by mouth every morning.   Yes Historical Provider, MD  polyethylene glycol (MIRALAX / GLYCOLAX) packet Take 17 g by mouth 2 (two) times daily.   Yes Historical Provider, MD  simethicone (GAS-X) 80 MG chewable tablet Chew 1 tablet (80 mg total) by mouth 4 (four) times daily as needed for flatulence. 03/30/15 03/29/16 Yes Sharman Cheek, MD  trolamine salicylate (ASPERCREME) 10 % cream Apply 1 application topically at bedtime as needed for muscle pain.   Yes Historical Provider, MD  umeclidinium bromide (INCRUSE ELLIPTA) 62.5 MCG/INH AEPB Inhale 1 puff into the lungs daily.   Yes Historical Provider, MD      VITAL SIGNS:  Blood pressure 135/75, pulse 72, temperature 95.9 F (35.5 C), temperature source Axillary, resp. rate 21, SpO2 100 %.  PHYSICAL EXAMINATION:  GENERAL:  82 y.o.-year-old patient lying in the bed with no acute distress.  obese. Patient is unresponsive. She is not on any sedation meds  EYES: Pupils  pinpoint equal, round,  minimal reactive to light and accommodation. No scleral icterus.  HEENT: Head atraumatic, normocephalic. Oropharynx and nasopharynx clear.  orally intubated  NECK:  Supple, no jugular venous distention. No thyroid enlargement, no tenderness.  LUNGS: Normal breath sounds bilaterally, no wheezing, rales,rhonchi or crepitation. No use of accessory muscles of  respiration.  CARDIOVASCULAR: S1, S2 normal. No murmurs, rubs, or gallops.  ABDOMEN: Soft, nontender, nondistended.  no Bowel sounds present. No organomegaly or mass.  EXTREMITIES: No pedal edema, cyanosis, or clubbing.  NEUROLOGIC: unable to assess since patient is intubated  PSYCHIATRIC: Unable to assess patient intubated  SKIN: No obvious rash, lesion, or ulcer.   LABORATORY PANEL:   CBC  Recent Labs Lab 01/21/16 1859  WBC 7.9  HGB 11.5*  HCT 36.0  PLT 196   ------------------------------------------------------------------------------------------------------------------  Chemistries   Recent Labs Lab 01/21/16 1859  NA 137  K 3.9  CL 106  CO2 17*  GLUCOSE 305*  BUN 25*  CREATININE 1.30*  CALCIUM 8.4*  AST 41  ALT 18  ALKPHOS 64  BILITOT 0.3   ------------------------------------------------------------------------------------------------------------------  Cardiac Enzymes  Recent Labs Lab 01/21/16 1859  TROPONINI 0.03   ------------------------------------------------------------------------------------------------------------------  RADIOLOGY:  Ct Head Wo Contrast  01/21/2016  CLINICAL DATA:  Status post cardiac arrest. Choked on piece of meat. Initial encounter. EXAM: CT HEAD WITHOUT CONTRAST TECHNIQUE: Contiguous axial images were obtained  from the base of the skull through the vertex without intravenous contrast. COMPARISON:  CT of the head performed 09/25/2014 FINDINGS: There is no evidence of acute infarction, mass lesion, or intra- or extra-axial hemorrhage on CT. Prominence of the ventricles and sulci reflects mild to moderate cortical volume loss. Cerebellar atrophy is noted. Scattered periventricular and subcortical white matter change likely reflects small vessel ischemic microangiopathy. Chronic lacunar infarcts are noted at the basal ganglia bilaterally and at the right thalamus. The brainstem and fourth ventricle are within normal limits. The  basal ganglia are unremarkable in appearance. The cerebral hemispheres demonstrate grossly normal gray-white differentiation. No mass effect or midline shift is seen. There is no evidence of fracture; visualized osseous structures are unremarkable in appearance. The orbits are within normal limits. The paranasal sinuses and mastoid air cells are well-aerated. No significant soft tissue abnormalities are seen. IMPRESSION: 1. No acute intracranial pathology seen on CT. 2. Mild to moderate cortical volume loss and scattered small vessel ischemic microangiopathy. 3. Chronic lacunar infarcts at the basal ganglia bilaterally, and at the right thalamus. Electronically Signed   By: Roanna RaiderJeffery  Chang M.D.   On: 01/21/2016 20:11   Dg Chest Port 1 View  01/21/2016  CLINICAL DATA:  Asystole EXAM: PORTABLE CHEST 1 VIEW COMPARISON:  03/30/2015 FINDINGS: Endotracheal tube tip between the clavicular heads and carina. An orogastric tube reaches the stomach. Perihilar interstitial coarsening. Low volume chest. Stable normal heart size and negative mediastinal contours when compared to prior. No evidence of effusion or air leak. No visible fracture. IMPRESSION: 1. Unremarkable positioning of endotracheal and orogastric tubes. 2. Interstitial opacities which could reflect edema or aspiration. Electronically Signed   By: Marnee SpringJonathon  Watts M.D.   On: 01/21/2016 19:48   Ct Renal Stone Study  01/21/2016  CLINICAL DATA:  Post cardiac arrest. EXAM: CT ABDOMEN AND PELVIS WITHOUT CONTRAST TECHNIQUE: Multidetector CT imaging of the abdomen and pelvis was performed following the standard protocol without IV contrast. COMPARISON:  06/12/2010 FINDINGS: Mild hazy bibasilar opacification likely atelectasis. Calcified plaque over the right coronary artery. There are minimally displaced acute fractures of a lower anterior rib bilaterally seen on the most superior image. Abdominal images demonstrate a nasogastric tube with tip within the distal  stomach. The liver, spleen, pancreas, gallbladder and adrenal glands are within normal. Kidneys are normal in size without hydronephrosis or nephrolithiasis. Small bilateral renal cysts are present. Ureters are within normal. Appendix is normal. Mild fecal retention throughout the colon as there is redundancy and dilatation of the air-filled sigmoid colon measuring 12 cm in diameter although no evidence of a volvulus. Small bowel is within normal. There is moderate calcified plaque over the abdominal aorta and iliac arteries per Pelvic images demonstrate the uterus, ovaries and rectum to be within normal. There is a small caliber catheter within the bladder with small collection of air within the bladder. Mild degenerative changes of the spinal multilevel disc disease over the lumbar spine. Mild loss of mid vertebral body height of L1 age indeterminate. Mild degenerate change of the hips. IMPRESSION: Minimally displaced acute rib fractures over the anterior ribs bilaterally on the most superior image. Mild posterior basilar atelectasis. Air-filled dilated and redundant sigmoid colon measuring 12 cm in diameter without evidence of volvulus. Likely ileus. Recommend follow-up as clinically indicated. Bilateral renal cysts. Tubes and lines as described. Mild loss of mid vertebral body height of L1 age indeterminate. Electronically Signed   By: Elberta Fortisaniel  Boyle M.D.   On: 01/21/2016 20:30  EKG:  Normal sinus rhythm with right bundle branch block and left anterior fascicular block  IMPRESSION AND PLAN:   [name]  is a 82 y.o. female with a known history of of COPD, dementia, diabetes, CKD-III, GERD who is a resident at [facility] comes to the emergency room after she had a cardiac arrest. EMS was notified. Patient was found to be in asystole received epinephrine and chest compressions for about 15 minutes. A King airway was placed. Patient was brought to the emergency room. Oral intubation was  attempted in the field and large piece of meat was removed from the upper airway. Patient was successfully intubated in the emergency room and large of yellow thick secretions with food material was suctioned.  1. Acute hypoxic respiratory failure status post cardiac respiratory arrest secondary to aspiration -Admit to ICU under intensivist -Vent management per ICU MD -IV fluids -IV sedation if needed -Empiric antibiotics with IV Vanco and Zosyn -Follow morning labs -CT head negative show some chronic strokes -Per ICU attending no indication for hypothermia protocol at present  2. Lactic acidosis secondary to #1 -Supportive care  3. Type 2 diabetes -Sliding-scale insulin for now if sugar remains continuously elevated consider hypoglycemia protocol  4. Chronic kidney disease stage III Continue IV hydration and monitor I's and O's avoid nephrotoxins  5. DVT prophylaxis subcutaneous heparin I will was discussed with ICU attending at [facility].  All the records are reviewed and case discussed with ED provider. Management plans discussed with the  family and they are in agreement. spoke at length with patient's 2 daughters. Patient does not have a designated healthcare power of attorney. For now daughters want aggressive management. They do understand patient is critically ill and is at a risk off the current cardiac arrest. They also understand patient may not survive this hospitalization. All questions answered.   CODE STAfull  TOTAL critical  TIME TAKING CARE OF THIS PAT .    [name] M.D on 01/21/2016 at 9:57 PM  Between 7am to 6pm - Pager - [phone]  After 6pm go to [email or web address] - password [email or web address] [facility]  [place] [facility]  Office  [phone]  CC: Primary care physician; No PCP Per Patient

## 2016-01-21 NOTE — ED Notes (Signed)
MD at bedside. 

## 2016-01-21 NOTE — ED Notes (Signed)
Family at bedside. x2

## 2016-01-21 NOTE — ED Notes (Signed)
Lower ext cool to touch, warm blanket applied.

## 2016-01-21 NOTE — Progress Notes (Signed)
Pt transported to ICU 15 without incident. Pt remained on transport vent until switched to Servo I vent. Report given to receiving RT.

## 2016-01-21 NOTE — ED Notes (Signed)
Pt transported to CT with RN and RT

## 2016-01-22 ENCOUNTER — Inpatient Hospital Stay: Payer: Medicare Other

## 2016-01-22 ENCOUNTER — Inpatient Hospital Stay: Admit: 2016-01-22 | Payer: Medicare Other

## 2016-01-22 ENCOUNTER — Inpatient Hospital Stay
Admit: 2016-01-22 | Discharge: 2016-01-22 | Disposition: A | Discharge status: Home / Self Care | Payer: Medicare Other | Attending: Pulmonary Disease | Admitting: Pulmonary Disease

## 2016-01-22 DIAGNOSIS — I469 Cardiac arrest, cause unspecified: Secondary | ICD-10-CM

## 2016-01-22 DIAGNOSIS — Z9911 Dependence on respirator [ventilator] status: Secondary | ICD-10-CM

## 2016-01-22 LAB — CBC
HCT: 32.8 % — ABNORMAL LOW (ref 35.0–47.0)
Hemoglobin: 11 g/dL — ABNORMAL LOW (ref 12.0–16.0)
MCH: 31.8 pg (ref 26.0–34.0)
MCHC: 33.5 g/dL (ref 32.0–36.0)
MCV: 94.9 fL (ref 80.0–100.0)
PLATELETS: 186 10*3/uL (ref 150–440)
RBC: 3.45 MIL/uL — ABNORMAL LOW (ref 3.80–5.20)
RDW: 14.5 % (ref 11.5–14.5)
WBC: 11.5 10*3/uL — AB (ref 3.6–11.0)

## 2016-01-22 LAB — GLUCOSE, CAPILLARY
GLUCOSE-CAPILLARY: 187 mg/dL — AB (ref 65–99)
Glucose-Capillary: 180 mg/dL — ABNORMAL HIGH (ref 65–99)
Glucose-Capillary: 181 mg/dL — ABNORMAL HIGH (ref 65–99)
Glucose-Capillary: 205 mg/dL — ABNORMAL HIGH (ref 65–99)
Glucose-Capillary: 213 mg/dL — ABNORMAL HIGH (ref 65–99)
Glucose-Capillary: 226 mg/dL — ABNORMAL HIGH (ref 65–99)
Glucose-Capillary: 234 mg/dL — ABNORMAL HIGH (ref 65–99)

## 2016-01-22 LAB — BASIC METABOLIC PANEL
Anion gap: 6 (ref 5–15)
BUN: 25 mg/dL — AB (ref 6–20)
CHLORIDE: 110 mmol/L (ref 101–111)
CO2: 22 mmol/L (ref 22–32)
CREATININE: 0.96 mg/dL (ref 0.44–1.00)
Calcium: 7.6 mg/dL — ABNORMAL LOW (ref 8.9–10.3)
GFR calc Af Amer: >60 mL/min (ref >60)
GFR, EST NON AFRICAN AMERICAN: 52 mL/min — AB (ref >60)
GLUCOSE: 232 mg/dL — AB (ref 65–99)
Potassium: 4.3 mmol/L (ref 3.5–5.1)
SODIUM: 138 mmol/L (ref 135–145)

## 2016-01-22 LAB — TROPONIN I
TROPONIN I: 0.8 ng/mL — AB (ref <0.031)
TROPONIN I: 0.79 ng/mL — AB (ref <0.031)

## 2016-01-22 LAB — MRSA PCR SCREENING: MRSA BY PCR: NEGATIVE

## 2016-01-22 MED ORDER — PRO-STAT SUGAR FREE PO LIQD: 30.0000 mL | Freq: Two times a day (BID) | ORAL | Status: DC

## 2016-01-22 MED ORDER — FREE WATER
100.0000 mL | Freq: Three times a day (TID) | Status: DC
  Administered 2016-01-22 – 2016-01-23 (×2): 100 mL

## 2016-01-22 MED ORDER — IBUPROFEN 100 MG/5ML PO SUSP
200.0000 mg | Freq: Four times a day (QID) | ORAL | Status: DC | PRN
Start: 2016-01-22 — End: 2016-01-26
  Administered 2016-01-23 – 2016-01-24 (×2): 200 mg via ORAL
  Filled 2016-01-22 (×3): qty 10

## 2016-01-22 MED ORDER — IBUPROFEN 100 MG/5ML PO SUSP
200.0000 mg | Freq: Four times a day (QID) | ORAL | Status: DC
  Administered 2016-01-22: 200 mg via ORAL
  Filled 2016-01-22 (×4): qty 10

## 2016-01-22 MED ORDER — VITAL HIGH PROTEIN PO LIQD: 1000.0000 mL | ORAL | Status: DC

## 2016-01-22 MED ORDER — ALBUTEROL SULFATE (2.5 MG/3ML) 0.083% IN NEBU: 2.5000 mg | INHALATION_SOLUTION | RESPIRATORY_TRACT | Status: DC | PRN

## 2016-01-22 MED ORDER — FENTANYL CITRATE (PF) 100 MCG/2ML IJ SOLN
25.0000 ug | INTRAMUSCULAR | Status: DC | PRN
  Administered 2016-01-23: 25 ug via INTRAVENOUS
  Administered 2016-01-23 – 2016-01-26 (×3): 50 ug via INTRAVENOUS
  Filled 2016-01-22 (×5): qty 2

## 2016-01-22 MED ORDER — VITAL HIGH PROTEIN PO LIQD
1000.0000 mL | ORAL | Status: DC
  Administered 2016-01-22: 1000 mL

## 2016-01-22 MED ORDER — LORAZEPAM 2 MG/ML IJ SOLN: 0.5000 mg | INTRAMUSCULAR | Status: DC | PRN

## 2016-01-22 MED ORDER — ACETAMINOPHEN 650 MG RE SUPP: 650.0000 mg | Freq: Four times a day (QID) | RECTAL | Status: DC | PRN

## 2016-01-22 MED ORDER — ACETAMINOPHEN 325 MG PO TABS
650.0000 mg | ORAL_TABLET | Freq: Four times a day (QID) | ORAL | Status: DC | PRN
  Administered 2016-01-23 (×3): 650 mg
  Filled 2016-01-22 (×4): qty 2

## 2016-01-22 MED ORDER — PANTOPRAZOLE SODIUM 40 MG PO PACK
40.0000 mg | PACK | Freq: Every day | ORAL | Status: DC
  Administered 2016-01-23 – 2016-01-26 (×4): 40 mg
  Filled 2016-01-22 (×4): qty 20

## 2016-01-22 MED ORDER — IPRATROPIUM-ALBUTEROL 0.5-2.5 (3) MG/3ML IN SOLN
3.0000 mL | Freq: Four times a day (QID) | RESPIRATORY_TRACT | Status: DC
  Administered 2016-01-22 – 2016-01-25 (×11): 3 mL via RESPIRATORY_TRACT
  Filled 2016-01-22 (×11): qty 3

## 2016-01-22 MED ORDER — LABETALOL HCL 5 MG/ML IV SOLN
10.0000 mg | INTRAVENOUS | Status: DC | PRN
  Administered 2016-01-22: 10 mg via INTRAVENOUS
  Filled 2016-01-22: qty 4

## 2016-01-22 MED ORDER — ACETAMINOPHEN 650 MG RE SUPP
650.0000 mg | Freq: Four times a day (QID) | RECTAL | Status: DC | PRN
  Administered 2016-01-22: 650 mg via RECTAL
  Filled 2016-01-22: qty 1

## 2016-01-22 MED ORDER — BUDESONIDE 0.25 MG/2ML IN SUSP
0.2500 mg | Freq: Four times a day (QID) | RESPIRATORY_TRACT | Status: DC
  Administered 2016-01-22 – 2016-01-25 (×12): 0.25 mg via RESPIRATORY_TRACT
  Filled 2016-01-22 (×12): qty 2

## 2016-01-22 MED ORDER — INSULIN ASPART 100 UNIT/ML ~~LOC~~ SOLN
0.0000 [IU] | SUBCUTANEOUS | Status: DC
  Administered 2016-01-22 (×2): 3 [IU] via SUBCUTANEOUS
  Administered 2016-01-22: 5 [IU] via SUBCUTANEOUS
  Administered 2016-01-23: 2 [IU] via SUBCUTANEOUS
  Administered 2016-01-23: 3 [IU] via SUBCUTANEOUS
  Administered 2016-01-23: 2 [IU] via SUBCUTANEOUS
  Administered 2016-01-23 – 2016-01-24 (×3): 5 [IU] via SUBCUTANEOUS
  Administered 2016-01-24 (×3): 3 [IU] via SUBCUTANEOUS
  Administered 2016-01-24: 5 [IU] via SUBCUTANEOUS
  Administered 2016-01-25: 2 [IU] via SUBCUTANEOUS
  Administered 2016-01-25 – 2016-01-26 (×6): 3 [IU] via SUBCUTANEOUS
  Administered 2016-01-26 (×2): 2 [IU] via SUBCUTANEOUS
  Filled 2016-01-22: qty 2
  Filled 2016-01-22: qty 5
  Filled 2016-01-22 (×2): qty 3
  Filled 2016-01-22: qty 5
  Filled 2016-01-22 (×4): qty 3
  Filled 2016-01-22: qty 2
  Filled 2016-01-22: qty 3
  Filled 2016-01-22: qty 5
  Filled 2016-01-22 (×2): qty 3
  Filled 2016-01-22: qty 5
  Filled 2016-01-22 (×2): qty 2
  Filled 2016-01-22: qty 3
  Filled 2016-01-22 (×2): qty 2
  Filled 2016-01-22 (×2): qty 3
  Filled 2016-01-22: qty 5

## 2016-01-22 MED ORDER — ACETAMINOPHEN 325 MG PO TABS
650.0000 mg | ORAL_TABLET | Freq: Four times a day (QID) | ORAL | Status: DC | PRN
  Administered 2016-01-22: 650 mg
  Filled 2016-01-22: qty 2

## 2016-01-22 NOTE — Progress Notes (Signed)
Palliative Care Update  I have reviewed pts record and will be available to become involved early next week if needed. Critical Care physician has had important conversations with family and pt will be observed for signs of anoxic brain injury appropriately over the next several days.    There is not an urgent need for a Palliative Care consult on this day.  Will check on pt early next week.  Suan HalterMargaret F Maxwel Meadowcroft, MD

## 2016-01-22 NOTE — Progress Notes (Signed)
Initial Nutrition Assessment  DOCUMENTATION CODES:   Obesity unspecified  INTERVENTION:   EN: recommend continuing Vital High Protein with new goal of 45 ml/hr with addition of Prostat BID providing 1280 kcals, 125 g of protein, 875 mL of free water. Meets 100% of calorie and protein needs as estimated per ASPEN guidelines. Free water flushes of 100 mL q 8 hours ordered by MD. Will continue to assess  NUTRITION DIAGNOSIS:   Inadequate oral intake related to acute illness as evidenced by NPO status.  GOAL:   Provide needs based on ASPEN/SCCM guidelines  MONITOR:   TF tolerance, Vent status, Labs, Weight trends  REASON FOR ASSESSMENT:   Ventilator, Consult Enteral/tube feeding initiation and management  ASSESSMENT:   82 yo female admitted with acute respiratory failure s/p cardiac arrest requiring intubation on 01/21/16, large piece of meat suctioned from airway during intubation. Hypothermic protocol not initiated  Patient is currently intubated on ventilator support  Past Medical History  Diagnosis Date  . Dementia   . Hypertension   . Renal disorder   . Diabetes mellitus without complication (HCC)   . COPD (chronic obstructive pulmonary disease) (HCC)   . GERD (gastroesophageal reflux disease)    Diet Order:   NPO  EN: Vital High Protein ordered per Adult Tube Feeding Protocol  Digestive System: 18 french OG in place, OG reaches stomach per chest xray, abdomen soft/distended, BS hypoactive  Skin:  Reviewed, no issues  Last BM:  no documented BM since admission   Labs:  Glucose Profile:   Recent Labs  01/22/16 0350 01/22/16 0757 01/22/16 1257  GLUCAP 213* 226* 205*   Meds: ss novolog  Height:   Ht Readings from Last 1 Encounters:  01/21/16 5\' 3"  (1.6 m)    Weight:   Wt Readings from Last 1 Encounters:  01/21/16 200 lb (90.719 kg)    Filed Weights   01/21/16 2215  Weight: 200 lb (90.719 kg)    BMI:  Body mass index is 35.44  kg/(m^2).  Estimated Nutritional Needs:   Kcal:  1000-1275 kcals (11-14 kcals/kg) per ASPEN guidelines for BMI >30  Protein:  104-130 g (1.5-2.0 g/kg)   Fluid:  >1.5 L  EDUCATION NEEDS:   Education needs no appropriate at this time  Romelle StarcherCate Merri Dimaano MS, RD, LDN [phone] Pager  [phone] Weekend/On-Call Pager

## 2016-01-22 NOTE — Progress Notes (Signed)
Notified Dr. Darrick Pennaeterding of Lactic acid and troponin results, no new orders received

## 2016-01-22 NOTE — Consult Note (Signed)
PULMONARY / CRITICAL CARE MEDICINE   Name: [name] MRN: [id number] DOB: 1928-03-22    ADMISSION DATE:  01/21/2016  PT PROFILE:   76 F NH resident with COPD, dementia and chronic debilitation who was witnessed to aspirate while eating and suffered cardiac arrest requiring 10-15 mins ACLS  MAJOR EVENTS/TEST RESULTS: 04/13 CT head: No acute intracranial pathology seen on CT. Mild to moderate cortical volume loss and scattered small vessel ischemic microangiopathy. Chronic lacunar infarcts at the basal ganglia bilaterally, and at the right thalamus 04/13 CTAP: likely ileus 04/14 TTE:  04/14 EEG: 04/14 Neurology Consultation:   INDWELLING DEVICES:: ETT 04/13 >>   MICRO DATA: MRSA PCR 04/13 >> NEG   ANTIMICROBIALS:  Pip-tazo 04/13 >>    HISTORY OF PRESENT ILLNESS:   Level 5 caveat  PAST MEDICAL HISTORY :  She  has a past medical history of Dementia; Hypertension; Renal disorder; Diabetes mellitus without complication (HCC); COPD (chronic obstructive pulmonary disease) (HCC); and GERD (gastroesophageal reflux disease).  PAST SURGICAL HISTORY: She  has no past surgical history on file.  No Known Allergies  No current facility-administered medications on file prior to encounter.   Current Outpatient Prescriptions on File Prior to Encounter  Medication Sig  . simethicone (GAS-X) 80 MG chewable tablet Chew 1 tablet (80 mg total) by mouth 4 (four) times daily as needed for flatulence.    FAMILY HISTORY:  Her has no family status information on file.   SOCIAL HISTORY: She  reports that she has been smoking.  She does not have any smokeless tobacco history on file. She reports that she does not drink alcohol.  REVIEW OF SYSTEMS:   Was in usual state of poor health prior to event of yesterday evening  SUBJECTIVE:    VITAL SIGNS: BP 186/72 mmHg  Pulse 97  Temp(Src) 101.8 F (38.8 C) (Core (Comment))  Resp 29  Ht  (1.6 m)  Wt 90.719 kg (200 lb)  BMI 35.44  kg/m2  SpO2 98%  HEMODYNAMICS:    VENTILATOR SETTINGS: Vent Mode:  [-] PRVC FiO2 (%):  [28 %-50 %] 28 % Set Rate:  [14 bmp] 14 bmp Vt Set:  [500 mL] 500 mL PEEP:  [5 cmH20] 5 cmH20  INTAKE / OUTPUT: I/O last 3 completed shifts: In: 1317.5 [I.V.:1217.5; IV Piggyback:100] Out: 1260 [Urine:1250; Emesis/NG output:10]  PHYSICAL EXAMINATION: General:  RASS -5, no spontaneous movement Neuro: Downward gaze, increased BLE tone HEENT: NCAT, PERRL Cardiovascular: regualr, no M Lungs: no wheezes, prolonged exp time Abdomen: obese, soft, +BS Ext: warm, no edema  LABS:  BMET  Recent Labs Lab 01/21/16 1859 01/21/16 2306 01/22/16 0522  NA 137  --  138  K 3.9  --  4.3  CL 106  --  110  CO2 17*  --  22  BUN 25*  --  25*  CREATININE 1.30* 1.07* 0.96  GLUCOSE 305*  --  232*    Electrolytes  Recent Labs Lab 01/21/16 1859 01/22/16 0522  CALCIUM 8.4* 7.6*    CBC  Recent Labs Lab 01/21/16 1859 01/21/16 2306 01/22/16 0522  WBC 7.9 11.5* 11.5*  HGB 11.5* 11.4* 11.0*  HCT 36.0 34.6* 32.8*  PLT 196 202 186    Coag's No results for input(s): APTT, INR in the last 168 hours.  Sepsis Markers  Recent Labs Lab 01/21/16 2109 01/21/16 2306  LATICACIDVEN 5.9* 4.7*    ABG  Recent Labs Lab 01/21/16 2044 01/22/16 0500  PHART 7.29* 7.40  PCO2ART 42 39  PO2ART 103 70*    Liver Enzymes  Recent Labs Lab 01/21/16 1859  AST 41  ALT 18  ALKPHOS 64  BILITOT 0.3  ALBUMIN 3.4*    Cardiac Enzymes  Recent Labs Lab 01/21/16 2306 01/22/16 0522 01/22/16 1056  TROPONINI 0.28* 0.80* 0.79*    Glucose  Recent Labs Lab 01/21/16 2020 01/21/16 2238 01/22/16 0023 01/22/16 0350 01/22/16 0757 01/22/16 1257  GLUCAP 224* 162* 187* 213* 226* 205*    CXR: cardiomegaly, diffuse interstitial infiltrates c/w edema    ASSESSMENT / PLAN:  PULMONARY A: VDRF post cardiorespiratory arrest due to aspirated meat P:   Vent settings established Vent bundle  implemented Daily SBT as indicated Nebulized steroids and bronchodilators   CARDIOVASCULAR A:  S/P cardiac arrest due to aspiration Cardiomegaly on CXR Minimally elevated trop I - likely demand ischemia around time of arrest P:  Hemodynamic monitoring Echocardiogram  RENAL A:   No issues P:  Monitor BMET intermittently Monitor I/Os Correct electrolytes as indicated  GASTROINTESTINAL A:   Obesity Possible ileus (by CTAP - unclear why this test was performed) P:   SUP: enteral PPI Begin TFs AM 04/14  HEMATOLOGIC A:   No issues P:  DVT px: SQ heparin Monitor CBC intermittently Transfuse per usual guidelines  INFECTIOUS A:   Aspiration - possible PNA P:   Monitor temp, WBC count Micro and abx as above  ENDOCRINE A:   DM 2 with hyperglycemia P:   Moderate scale SSI  NEUROLOGIC A:   Post arrest anoxic encephalopathy Prognosis guarded P:   RASS goal: 0, -1 EEG 04/14 Neuro consult requested 04/14 for prognostication   FAMILY  - Updates: Sister and 2 daughters updated in detail I explained pt's critical status and the biggest concern of post anoxic brain injury with guarded prognosis. I suggested tat we provide support for next couple of days to see if there is any improvement and, if not, that we consider discontinuation of life-sustaining therapies unless Neurology believes functional recovery is possible  CCM time: 50 mins The above time includes time spent in consultation with patient and/or family members and reviewing care plan on multidisciplinary rounds  Billy Fischeravid Rohaan Durnil, MD PCCM service Mobile [phone] Pager [phone]     01/22/2016, 3:25 PM

## 2016-01-22 NOTE — Progress Notes (Signed)
eLink Physician-Brief Progress Note Patient Name: Sue CraverSelena Gates DOB: [birth year] MRN: [id number]   Date of Service  01/22/2016  HPI/Events of Note  Hypertension with current BP of 181/77 (107).  HR is 87  eICU Interventions  Plan: Labetalol 10 mg IV q2 hours prn systolic BP greater than 170 mmHg     Intervention Category Intermediate Interventions: Hypertension - evaluation and management  DETERDING,ELIZABETH 01/22/2016, 6:37 AM

## 2016-01-22 NOTE — Progress Notes (Signed)
Inpatient Diabetes Program Recommendations  AACE/ADA: New Consensus Statement on Inpatient Glycemic Control (2015)  Target Ranges:  Prepandial:   less than 140 mg/dL      Peak postprandial:   less than 180 mg/dL (1-2 hours)      Critically ill patients:  140 - 180 mg/dL   Review of Glycemic Control  Results for [name], [name] (MRN [id number]) as of 01/22/2016 10:30  Ref. Range 01/21/2016 20:20 01/21/2016 22:38 01/22/2016 00:23 01/22/2016 03:50 01/22/2016 07:57  Glucose-Capillary Latest Ref Range: 65-99 mg/dL 409224 (H) 811162 (H) 914187 (H) 213 (H) 226 (H)    Diabetes history: Type 2 Outpatient Diabetes medications: Levemir 30 units qhs, Novolin @ 12 units bid, Novolin 4 units 1x/day Current orders for Inpatient glycemic control: Novolog 0-15 units tid, Novolog 0-9 units qhs  Inpatient Diabetes Program Recommendations:  Fasting blood sugar elevated- takes Levemir 30 units at home.  Consider starting Levemir 15 units qhs.    Susette RacerJulie Muriel Hannold, RN, BA, MHA, CDE Diabetes Coordinator Inpatient Diabetes Program  [phone] (Team Pager) [phone] [facility] Office) 01/22/2016 10:36 AM

## 2016-01-22 NOTE — Progress Notes (Signed)
*  PRELIMINARY RESULTS* Echocardiogram 2D Echocardiogram has been performed.  [name] 01/22/2016, 9:31 PM

## 2016-01-22 NOTE — Progress Notes (Signed)
Notified Dr. Darrick Pennaeterding of troponig result and SBP  Of 170-180, see MD orders

## 2016-01-22 NOTE — Progress Notes (Signed)
EEG completed, results pending. 

## 2016-01-22 NOTE — Care Management (Signed)
Presents from [facility] for cardiorespiratory arrest.  She is currently intubated and on full ventilator support.  Confirmed with [facility] that patient is indeed a resident and under a long term plan of care.  She is currently a full code and palliative care consult is present.  Referral to CSW

## 2016-01-22 NOTE — Progress Notes (Signed)
Pharmacy Antibiotic Note  Sue CraverSelena Gates is a 82 y.o. female admitted on 01/21/2016 with pneumonia.  Pharmacy has been consulted for Zosyn and vancomycin dosing.  Plan: Will continue Zosyn 3.375 gm IV Q8H EI. Vancomycin d/c, MRSA PCR negative.    Height: 5\' 3"  (160 cm) Weight: 200 lb (90.719 kg) IBW/kg (Calculated) : 52.4  Temp (24hrs), Avg:98.4 F (36.9 C), Min:95.8 F (35.4 C), Max:100.8 F (38.2 C)   Recent Labs Lab 01/21/16 1859 01/21/16 2109 01/21/16 2306 01/22/16 0522  WBC 7.9  --  11.5* 11.5*  CREATININE 1.30*  --  1.07* 0.96  LATICACIDVEN  --  5.9* 4.7*  --     Estimated Creatinine Clearance: 45.8 mL/min (by C-G formula based on Cr of 0.96).    No Known Allergies   Results for orders placed or performed during the hospital encounter of 01/21/16  MRSA PCR Screening     Status: None   Collection Time: 01/21/16 10:39 PM  Result Value Ref Range Status   MRSA by PCR NEGATIVE NEGATIVE Final    Comment:        The GeneXpert MRSA Assay (FDA approved for NASAL specimens only), is one component of a comprehensive MRSA colonization surveillance program. It is not intended to diagnose MRSA infection nor to guide or monitor treatment for MRSA infections.    Anti-infectives    Start     Dose/Rate Route Frequency Ordered Stop   01/22/16 1200  vancomycin (VANCOCIN) IVPB 1000 mg/200 mL premix  Status:  Discontinued     1,000 mg 200 mL/hr over 60 Minutes Intravenous Every 24 hours 01/21/16 2250 01/22/16 1004   01/22/16 0600  piperacillin-tazobactam (ZOSYN) IVPB 3.375 g     3.375 g 12.5 mL/hr over 240 Minutes Intravenous 3 times per day 01/21/16 2250     01/21/16 2230  piperacillin-tazobactam (ZOSYN) IVPB 3.375 g     3.375 g 100 mL/hr over 30 Minutes Intravenous  Once 01/21/16 2220 01/21/16 2341   01/21/16 2230  vancomycin (VANCOCIN) IVPB 1000 mg/200 mL premix     1,000 mg 200 mL/hr over 60 Minutes Intravenous  Once 01/21/16 2227 01/22/16 0011      Thank you for  allowing pharmacy to be a part of this patient's care.  Luisa Harthristy, Evette Diclemente D, Pharm.D., BCPS Clinical Pharmacist 01/22/2016 11:47 AM

## 2016-01-23 ENCOUNTER — Inpatient Hospital Stay: Payer: Medicare Other

## 2016-01-23 LAB — COMPREHENSIVE METABOLIC PANEL
ALK PHOS: 44 U/L (ref 38–126)
ALT: 27 U/L (ref 14–54)
ANION GAP: 6 (ref 5–15)
AST: 63 U/L — ABNORMAL HIGH (ref 15–41)
Albumin: 3.2 g/dL — ABNORMAL LOW (ref 3.5–5.0)
BUN: 23 mg/dL — ABNORMAL HIGH (ref 6–20)
CALCIUM: 7.7 mg/dL — AB (ref 8.9–10.3)
CO2: 23 mmol/L (ref 22–32)
Chloride: 103 mmol/L (ref 101–111)
Creatinine, Ser: 1.19 mg/dL — ABNORMAL HIGH (ref 0.44–1.00)
GFR, EST AFRICAN AMERICAN: 47 mL/min — AB (ref >60)
GFR, EST NON AFRICAN AMERICAN: 40 mL/min — AB (ref >60)
Glucose, Bld: 159 mg/dL — ABNORMAL HIGH (ref 65–99)
Potassium: 3.7 mmol/L (ref 3.5–5.1)
SODIUM: 132 mmol/L — AB (ref 135–145)
Total Bilirubin: 0.7 mg/dL (ref 0.3–1.2)
Total Protein: 6.7 g/dL (ref 6.5–8.1)

## 2016-01-23 LAB — BLOOD GAS, ARTERIAL
ACID-BASE EXCESS: 0.5 mmol/L (ref 0.0–3.0)
BICARBONATE: 24.5 meq/L (ref 21.0–28.0)
FIO2: 0.28
MECHVT: 500 mL
Mechanical Rate: 14
O2 SAT: 95.9 %
PCO2 ART: 36 mmHg (ref 32.0–48.0)
PEEP: 5 cmH2O
PH ART: 7.44 (ref 7.350–7.450)
PO2 ART: 78 mmHg — AB (ref 83.0–108.0)
Patient temperature: 37

## 2016-01-23 LAB — EXPECTORATED SPUTUM ASSESSMENT W GRAM STAIN, RFLX TO RESP C

## 2016-01-23 LAB — CBC
HCT: 30.9 % — ABNORMAL LOW (ref 35.0–47.0)
HEMOGLOBIN: 10.4 g/dL — AB (ref 12.0–16.0)
MCH: 31.3 pg (ref 26.0–34.0)
MCHC: 33.7 g/dL (ref 32.0–36.0)
MCV: 92.8 fL (ref 80.0–100.0)
PLATELETS: 162 10*3/uL (ref 150–440)
RBC: 3.33 MIL/uL — ABNORMAL LOW (ref 3.80–5.20)
RDW: 14.5 % (ref 11.5–14.5)
WBC: 10.6 10*3/uL (ref 3.6–11.0)

## 2016-01-23 LAB — GLUCOSE, CAPILLARY
GLUCOSE-CAPILLARY: 174 mg/dL — AB (ref 65–99)
GLUCOSE-CAPILLARY: 210 mg/dL — AB (ref 65–99)
Glucose-Capillary: 146 mg/dL — ABNORMAL HIGH (ref 65–99)
Glucose-Capillary: 144 mg/dL — ABNORMAL HIGH (ref 65–99)
Glucose-Capillary: 160 mg/dL — ABNORMAL HIGH (ref 65–99)

## 2016-01-23 LAB — PHOSPHORUS: PHOSPHORUS: 2.3 mg/dL — AB (ref 2.5–4.6)

## 2016-01-23 LAB — MAGNESIUM: MAGNESIUM: 1.7 mg/dL (ref 1.7–2.4)

## 2016-01-23 MED ORDER — ISOSORBIDE DINITRATE 30 MG PO TABS: 30.0000 mg | ORAL_TABLET | Freq: Two times a day (BID) | ORAL | Status: DC

## 2016-01-23 MED ORDER — HYDRALAZINE HCL 20 MG/ML IJ SOLN
10.0000 mg | INTRAMUSCULAR | Status: DC | PRN
  Administered 2016-01-23 – 2016-01-25 (×3): 10 mg via INTRAVENOUS
  Filled 2016-01-23 (×4): qty 1

## 2016-01-23 MED ORDER — HYDRALAZINE HCL 20 MG/ML IJ SOLN
10.0000 mg | Freq: Once | INTRAMUSCULAR | Status: AC
  Administered 2016-01-23: 10 mg via INTRAVENOUS

## 2016-01-23 MED ORDER — AMLODIPINE BESYLATE 5 MG PO TABS
5.0000 mg | ORAL_TABLET | Freq: Every day | ORAL | Status: DC
  Administered 2016-01-23 – 2016-01-26 (×4): 5 mg
  Filled 2016-01-23 (×5): qty 1

## 2016-01-23 MED ORDER — LABETALOL HCL 5 MG/ML IV SOLN: 10.0000 mg | INTRAVENOUS | Status: DC | PRN

## 2016-01-23 NOTE — Progress Notes (Signed)
PULMONARY / CRITICAL CARE MEDICINE   Name: [name] MRN: [id number] DOB: [birth year]    ADMISSION DATE:  01/21/2016  PT PROFILE:   66 F NH resident with COPD, dementia and chronic debilitation who was witnessed to aspirate while eating and suffered cardiac arrest requiring 10-15 mins ACLS  MAJOR EVENTS/TEST RESULTS: 04/13 CT head: No acute intracranial pathology seen on CT. Mild to moderate cortical volume loss and scattered small vessel ischemic microangiopathy. Chronic lacunar infarcts at the basal ganglia bilaterally, and at the right thalamus  04/13 CTAP: likely ileus  04/14 TTE: Results pending. 04/14 EEG: Results pending. 04/14 Neurology Consultation: Pending.  INDWELLING DEVICES:: ETT 04/13 >>    MICRO DATA: MRSA PCR 04/13 >> NEG   ANTIMICROBIALS:  Pip-tazo 04/13 >>    SUBJECTIVE/HISTORY OF PRESENT ILLNESS:   Patient cannot provide a review of systems or history of present illness due to unresponsiveness as she is on the ventilator.    VITAL SIGNS: BP 144/77 mmHg  Pulse 70  Temp(Src) 100.4 F (38 C) (Core (Comment))  Resp 25  Ht _0  (1.6 m)  Wt 210 lb 12.2 oz (95.6 kg)  BMI 37.34 kg/m2  SpO2 98%  HEMODYNAMICS:    VENTILATOR SETTINGS: Vent Mode:  [-] PRVC FiO2 (%):  [28 %] 28 % Set Rate:  [14 bmp] 14 bmp Vt Set:  [500 mL] 500 mL PEEP:  [5 cmH20] 5 cmH20  INTAKE / OUTPUT: I/O last 3 completed shifts: In: 1618.8 [I.V.:1367.5; NG/GT:51.3; IV Piggyback:200] Out: 1324 [MWNUU:7253; Emesis/NG output:10]  PHYSICAL EXAMINATION: General:  RASS -5, no spontaneous movement Neuro: Downward gaze, increased BLE tone, response to painful stimuli. HEENT: NCAT, PERRL Cardiovascular: regualr, no M Lungs: no wheezes, prolonged exp time Abdomen: obese, soft, +BS Ext: warm, no edema  LABS:  BMET  Recent Labs Lab 01/21/16 1859 01/21/16 2306 01/22/16 0522 01/23/16 0602  NA 137  --  138 132*  K 3.9  --  4.3 3.7  CL 106  --  110 103  CO2 17*  --  22  23  BUN 25*  --  25* 23*  CREATININE 1.30* 1.07* 0.96 1.19*  GLUCOSE 305*  --  232* 159*    Electrolytes  Recent Labs Lab 01/21/16 1859 01/22/16 0522 01/23/16 0602  CALCIUM 8.4* 7.6* 7.7*  MG  --   --  1.7  PHOS  --   --  2.3*    CBC  Recent Labs Lab 01/21/16 2306 01/22/16 0522 01/23/16 0602  WBC 11.5* 11.5* 10.6  HGB 11.4* 11.0* 10.4*  HCT 34.6* 32.8* 30.9*  PLT 202 186 162    Coag's No results for input(s): APTT, INR in the last 168 hours.  Sepsis Markers  Recent Labs Lab 01/21/16 2109 01/21/16 2306  LATICACIDVEN 5.9* 4.7*    ABG  Recent Labs Lab 01/21/16 2044 01/22/16 0500 01/23/16 0433  PHART 7.29* 7.40 7.44  PCO2ART 42 39 36  PO2ART 103 70* 78*    Liver Enzymes  Recent Labs Lab 01/21/16 1859 01/23/16 0602  AST 41 63*  ALT 18 27  ALKPHOS 64 44  BILITOT 0.3 0.7  ALBUMIN 3.4* 3.2*    Cardiac Enzymes  Recent Labs Lab 01/21/16 2306 01/22/16 0522 01/22/16 1056  TROPONINI 0.28* 0.80* 0.79*    Glucose  Recent Labs Lab 01/22/16 1257 01/22/16 1730 01/22/16 1953 01/22/16 2351 01/23/16 0420 01/23/16 0712  GLUCAP 205* 180* 181* 234* 174* 146*    CXR: cardiomegaly, diffuse interstitial infiltrates c/w edema    ASSESSMENT /  PLAN:  PULMONARY A: VDRF post cardiorespiratory arrest due to aspirated meat P:   Vent settings established Vent bundle implemented Daily SBT as indicated Nebulized steroids and bronchodilators   CARDIOVASCULAR A:  S/P cardiac arrest due to aspiration Cardiomegaly on CXR Minimally elevated trop I - likely demand ischemia around time of arrest Aspiration of food, we are continuing to suction food from her lung. P:  Hemodynamic monitoring Echocardiogram The patient may undergo a terminal wean in the next 24-48 hours, therefore we'll hold off on bronchoscopy unless aggressive care is desired. It is unlikely that bronchoscopy with airway clearance will meaningfully improve her outcome , given her  poor very poor prognosis and unresponsive state at this time.  RENAL A:   No issues P:  Monitor BMET intermittently Monitor I/Os Correct electrolytes as indicated  GASTROINTESTINAL A:   Obesity Possible ileus (by CTAP - unclear why this test was performed) P:   SUP: enteral PPI Begin TFs AM 04/14  HEMATOLOGIC A:   No issues P:  DVT px: SQ heparin Monitor CBC intermittently Transfuse per usual guidelines  INFECTIOUS A:   Aspiration - possible PNA P:   Monitor temp, WBC count Micro and abx as above  ENDOCRINE A:   DM 2 with hyperglycemia P:   Moderate scale SSI  NEUROLOGIC A:   Post arrest anoxic encephalopathy Prognosis guarded Onsedation, the patient continues to be unresponsive. P:   RASS goal: 0, -1 EEG 04/14 Neuro consult requested 04/14 for prognostication  Family meeting: Met with the patient's daughter and explained again the poor prognosis, as well as the patient's most likely course, she stated that she had multiple patient would recover. She would like to wait until Monday to make any further decisions, and would like the patient to remain full code until that time.  Marda Stalker M.D. Pager [phone]     01/23/2016, 10:50 AM   Critical Care Attestation.  I have personally obtained a history, examined the patient, evaluated laboratory and imaging results, formulated the assessment and plan and placed orders. The Patient requires high complexity decision making for assessment and support, frequent evaluation and titration of therapies, application of advanced monitoring technologies and extensive interpretation of multiple databases. The patient has critical illness that could lead imminently to failure of 1 or more organ systems and requires the highest level of physician preparedness to intervene.  Critical Care Time devoted to patient care services described in this note is 45 minutes and is exclusive of time spent in procedures.

## 2016-01-23 NOTE — Consult Note (Signed)
CC: cardiac arrest   HPI: Allahna Husband is an 82 y.o. female admitted from [place] [facility] via EMS due cardiac arrest. Pt was in asystole for about 15 minutes.   Past Medical History  Diagnosis Date  . Dementia   . Hypertension   . Renal disorder   . Diabetes mellitus without complication (HCC)   . COPD (chronic obstructive pulmonary disease) (HCC)   . GERD (gastroesophageal reflux disease)     History reviewed. No pertinent past surgical history.  History reviewed. No pertinent family history.  Social History:  reports that she has been smoking.  She does not have any smokeless tobacco history on file. She reports that she does not drink alcohol. Her drug history is not on file.  No Known Allergies  Medications: I have reviewed the patient's current medications.  ROS: Not able to obtain as not following commands.   Physical Examination: Blood pressure 144/77, pulse 70, temperature 100.4 F (38 C), temperature source Core (Comment), resp. rate 25, height  (1.6 m), weight 210 lb 12.2 oz (95.6 kg), SpO2 98 %.  Intubated. Not following commands Sluggish puils Tripple flexion of Lower extremities No withdrawal from painful stimuli.   Laboratory Studies:   Basic Metabolic Panel:  Recent Labs Lab 01/21/16 1859 01/21/16 2306 01/22/16 0522 01/23/16 0602  NA 137  --  138 132*  K 3.9  --  4.3 3.7  CL 106  --  110 103  CO2 17*  --  22 23  GLUCOSE 305*  --  232* 159*  BUN 25*  --  25* 23*  CREATININE 1.30* 1.07* 0.96 1.19*  CALCIUM 8.4*  --  7.6* 7.7*  MG  --   --   --  1.7  PHOS  --   --   --  2.3*    Liver Function Tests:  Recent Labs Lab 01/21/16 1859 01/23/16 0602  AST 41 63*  ALT 18 27  ALKPHOS 64 44  BILITOT 0.3 0.7  PROT 7.2 6.7  ALBUMIN 3.4* 3.2*   No results for input(s): LIPASE, AMYLASE in the last 168 hours. No results for input(s): AMMONIA in the last 168 hours.  CBC:  Recent Labs Lab 01/21/16 1859 01/21/16 2306 01/22/16 0522  01/23/16 0602  WBC 7.9 11.5* 11.5* 10.6  NEUTROABS 2.6  --   --   --   HGB 11.5* 11.4* 11.0* 10.4*  HCT 36.0 34.6* 32.8* 30.9*  MCV 100.2* 94.8 94.9 92.8  PLT 196 202 186 162    Cardiac Enzymes:  Recent Labs Lab 01/21/16 1859 01/21/16 2306 01/22/16 0522 01/22/16 1056  TROPONINI 0.03 0.28* 0.80* 0.79*    BNP: Invalid input(s): POCBNP  CBG:  Recent Labs Lab 01/22/16 1953 01/22/16 2351 01/23/16 0420 01/23/16 0712 01/23/16 1110  GLUCAP 181* 234* 174* 146* 144*    Microbiology: Results for orders placed or performed during the hospital encounter of 01/21/16  MRSA PCR Screening     Status: None   Collection Time: 01/21/16 10:39 PM  Result Value Ref Range Status   MRSA by PCR NEGATIVE NEGATIVE Final    Comment:        The GeneXpert MRSA Assay (FDA approved for NASAL specimens only), is one component of a comprehensive MRSA colonization surveillance program. It is not intended to diagnose MRSA infection nor to guide or monitor treatment for MRSA infections.     Coagulation Studies: No results for input(s): LABPROT, INR in the last 72 hours.  Urinalysis: No results  for input(s): COLORURINE, LABSPEC, PHURINE, GLUCOSEU, HGBUR, BILIRUBINUR, KETONESUR, PROTEINUR, UROBILINOGEN, NITRITE, LEUKOCYTESUR in the last 168 hours.  Invalid input(s): APPERANCEUR  Lipid Panel:  No results found for: CHOL, TRIG, HDL, CHOLHDL, VLDL, LDLCALC  HgbA1C: No results found for: HGBA1C  Urine Drug Screen:     Component Value Date/Time   LABOPIA NEGATIVE 02/17/2012 1130   LABBENZ NEGATIVE 02/17/2012 1130   AMPHETMU NEGATIVE 02/17/2012 1130   THCU NEGATIVE 02/17/2012 1130   LABBARB NEGATIVE 02/17/2012 1130    Alcohol Level: No results for input(s): ETH in the last 168 hours.  Other results: EKG: normal EKG, normal sinus rhythm, unchanged from previous tracings.  Imaging: Ct Head Wo Contrast  01/21/2016  CLINICAL DATA:  Status post cardiac arrest. Choked on piece of meat.  Initial encounter. EXAM: CT HEAD WITHOUT CONTRAST TECHNIQUE: Contiguous axial images were obtained from the base of the skull through the vertex without intravenous contrast. COMPARISON:  CT of the head performed 09/25/2014 FINDINGS: There is no evidence of acute infarction, mass lesion, or intra- or extra-axial hemorrhage on CT. Prominence of the ventricles and sulci reflects mild to moderate cortical volume loss. Cerebellar atrophy is noted. Scattered periventricular and subcortical white matter change likely reflects small vessel ischemic microangiopathy. Chronic lacunar infarcts are noted at the basal ganglia bilaterally and at the right thalamus. The brainstem and fourth ventricle are within normal limits. The basal ganglia are unremarkable in appearance. The cerebral hemispheres demonstrate grossly normal gray-white differentiation. No mass effect or midline shift is seen. There is no evidence of fracture; visualized osseous structures are unremarkable in appearance. The orbits are within normal limits. The paranasal sinuses and mastoid air cells are well-aerated. No significant soft tissue abnormalities are seen. IMPRESSION: 1. No acute intracranial pathology seen on CT. 2. Mild to moderate cortical volume loss and scattered small vessel ischemic microangiopathy. 3. Chronic lacunar infarcts at the basal ganglia bilaterally, and at the right thalamus. Electronically Signed   By: Roanna Raider M.D.   On: 01/21/2016 20:11   Dg Chest Port 1 View  01/23/2016  CLINICAL DATA:  Respiratory failure. EXAM: PORTABLE CHEST 1 VIEW COMPARISON:  01/21/2016. FINDINGS: Endotracheal tube tip 1.5 cm above the carina. Nasogastric tube extending into the stomach. Normal sized heart. Prominent interstitial markings with improvement. No pleural fluid. Diffuse osteopenia. IMPRESSION: Mild to moderate chronic interstitial lung disease. No acute abnormality. Electronically Signed   By: Beckie Salts M.D.   On: 01/23/2016 07:31    Dg Chest Port 1 View  01/21/2016  CLINICAL DATA:  Asystole EXAM: PORTABLE CHEST 1 VIEW COMPARISON:  03/30/2015 FINDINGS: Endotracheal tube tip between the clavicular heads and carina. An orogastric tube reaches the stomach. Perihilar interstitial coarsening. Low volume chest. Stable normal heart size and negative mediastinal contours when compared to prior. No evidence of effusion or air leak. No visible fracture. IMPRESSION: 1. Unremarkable positioning of endotracheal and orogastric tubes. 2. Interstitial opacities which could reflect edema or aspiration. Electronically Signed   By: Marnee Spring M.D.   On: 01/21/2016 19:48   Ct Renal Stone Study  01/21/2016  CLINICAL DATA:  Post cardiac arrest. EXAM: CT ABDOMEN AND PELVIS WITHOUT CONTRAST TECHNIQUE: Multidetector CT imaging of the abdomen and pelvis was performed following the standard protocol without IV contrast. COMPARISON:  06/12/2010 FINDINGS: Mild hazy bibasilar opacification likely atelectasis. Calcified plaque over the right coronary artery. There are minimally displaced acute fractures of a lower anterior rib bilaterally seen on the most superior image. Abdominal images demonstrate a  nasogastric tube with tip within the distal stomach. The liver, spleen, pancreas, gallbladder and adrenal glands are within normal. Kidneys are normal in size without hydronephrosis or nephrolithiasis. Small bilateral renal cysts are present. Ureters are within normal. Appendix is normal. Mild fecal retention throughout the colon as there is redundancy and dilatation of the air-filled sigmoid colon measuring 12 cm in diameter although no evidence of a volvulus. Small bowel is within normal. There is moderate calcified plaque over the abdominal aorta and iliac arteries per Pelvic images demonstrate the uterus, ovaries and rectum to be within normal. There is a small caliber catheter within the bladder with small collection of air within the bladder. Mild degenerative  changes of the spinal multilevel disc disease over the lumbar spine. Mild loss of mid vertebral body height of L1 age indeterminate. Mild degenerate change of the hips. IMPRESSION: Minimally displaced acute rib fractures over the anterior ribs bilaterally on the most superior image. Mild posterior basilar atelectasis. Air-filled dilated and redundant sigmoid colon measuring 12 cm in diameter without evidence of volvulus. Likely ileus. Recommend follow-up as clinically indicated. Bilateral renal cysts. Tubes and lines as described. Mild loss of mid vertebral body height of L1 age indeterminate. Electronically Signed   By: Elberta Fortisaniel  Boyle M.D.   On: 01/21/2016 20:30     Assessment/Plan:  82 y.o. female admitted from [facility] via EMS due cardiac arrest. Pt was in asystole for about 15 minutes.  CTH reviewed. No acute abnormality.    S/p discussion with family. Likely very poor prognosis for any meaningful recovery.  Explained anoxic injury to family.  Code status discussion. Family still wants full code for now.  [facility].  Again very poor prognosis.  Pauletta BrownsZEYLIKMAN, Mischa Brittingham  01/23/2016, 11:23 AM

## 2016-01-23 NOTE — NC FL2 (Signed)
[place] LEVEL OF CARE SCREENING TOOL     IDENTIFICATION  Patient Name: [name] Birthdate: Oct 31, 1927 Sex: female Admission Date (Current Location): 01/21/2016  [place] and [place] Number:  Engineering geologist and Address:  [facility], [address] [place], [place] [address]      Provider Number: [id number]  Attending Physician Name and Address:  Wilhelmina Mcardle, MD  Relative Name and Phone Number:       Current Level of Care: Hospital Recommended Level of Care: [place] Prior Approval Number:    Date Approved/Denied:   PASRR Number:  [id number]  Discharge Plan: SNF    Current Diagnoses: Patient Active Problem List   Diagnosis Date Noted  . Cardiorespiratory arrest (Jud) 01/21/2016    Orientation RESPIRATION BLADDER Height & Weight     Self  Vent Incontinent Weight: 210 lb 12.2 oz (95.6 kg) Height:  _0  (160 cm)  BEHAVIORAL SYMPTOMS/MOOD NEUROLOGICAL BOWEL NUTRITION STATUS      Incontinent Diet (Normal)  AMBULATORY STATUS COMMUNICATION OF NEEDS Skin   Extensive Assist Verbally Normal                       Personal Care Assistance Level of Assistance  Bathing, Feeding, Dressing, Total care Bathing Assistance: Maximum assistance Feeding assistance: Maximum assistance Dressing Assistance: Maximum assistance Total Care Assistance: Maximum assistance   Functional Limitations Info  Sight, Hearing, Speech Sight Info: Adequate Hearing Info: Adequate Speech Info: Adequate    SPECIAL CARE FACTORS FREQUENCY                       Contractures Contractures Info: Not present    Additional Factors Info  Code Status Code Status Info: Full             Current Medications (01/23/2016):  This is the current hospital active medication list Current Facility-Administered Medications  Medication Dose Route Frequency Provider Last Rate Last Dose  . acetaminophen (TYLENOL) tablet 650 mg   650 mg Per Tube Q6H PRN Mikael Spray, NP   650 mg at 01/23/16 0932   Or  . acetaminophen (TYLENOL) suppository 650 mg  650 mg Rectal Q6H PRN Mikael Spray, NP      . albuterol (PROVENTIL) (2.5 MG/3ML) 0.083% nebulizer solution 2.5 mg  2.5 mg Nebulization Q3H PRN Wilhelmina Mcardle, MD      . antiseptic oral rinse solution (CORINZ)  7 mL Mouth Rinse 10 times per day Fritzi Mandes, MD   7 mL at 01/23/16 0545  . budesonide (PULMICORT) nebulizer solution 0.25 mg  0.25 mg Nebulization 4 times per day Wilhelmina Mcardle, MD   0.25 mg at 01/23/16 0807  . chlorhexidine gluconate (SAGE KIT) (PERIDEX) 0.12 % solution 15 mL  15 mL Mouth Rinse BID Fritzi Mandes, MD   15 mL at 01/23/16 0831  . feeding supplement (PRO-STAT SUGAR FREE 64) liquid 30 mL  30 mL Oral BID Wilhelmina Mcardle, MD   30 mL at 01/22/16 1726  . feeding supplement (VITAL HIGH PROTEIN) liquid 1,000 mL  1,000 mL Per Tube Continuous Wilhelmina Mcardle, MD   Stopped at 01/22/16 1704  . fentaNYL (SUBLIMAZE) injection 25-50 mcg  25-50 mcg Intravenous Q2H PRN Wilhelmina Mcardle, MD   25 mcg at 01/23/16 0304  . free water 100 mL  100 mL Per Tube 3 times per day Wilhelmina Mcardle, MD   100 mL  at 01/22/16 1430  . heparin injection 5,000 Units  5,000 Units Subcutaneous 3 times per day Fritzi Mandes, MD   5,000 Units at 01/23/16 0544  . hydrALAZINE (APRESOLINE) injection 10 mg  10 mg Intravenous Q4H PRN Mikael Spray, NP      . ibuprofen (ADVIL,MOTRIN) 100 MG/5ML suspension 200 mg  200 mg Oral Q6H PRN Laverle Hobby, MD   200 mg at 01/23/16 0313  . insulin aspart (novoLOG) injection 0-15 Units  0-15 Units Subcutaneous 6 times per day Wilhelmina Mcardle, MD   2 Units at 01/23/16 0830  . ipratropium-albuterol (DUONEB) 0.5-2.5 (3) MG/3ML nebulizer solution 3 mL  3 mL Nebulization Q6H Wilhelmina Mcardle, MD   3 mL at 01/23/16 0807  . LORazepam (ATIVAN) injection 0.5-1 mg  0.5-1 mg Intravenous Q4H PRN Wilhelmina Mcardle, MD      . pantoprazole sodium (PROTONIX) 40 mg/20  mL oral suspension 40 mg  40 mg Per Tube Q1200 Wilhelmina Mcardle, MD   40 mg at 01/22/16 1413  . piperacillin-tazobactam (ZOSYN) IVPB 3.375 g  3.375 g Intravenous 3 times per day Fritzi Mandes, MD   3.375 g at 01/23/16 0545     Discharge Medications: Please see discharge summary for a list of discharge medications.  Relevant Imaging Results:  Relevant Lab Results:   Additional Information SSN [id number]  Joana Reamer, [place]

## 2016-01-23 NOTE — Clinical Social Work Note (Signed)
Clinical Social Work Assessment  Patient Details  Name: [name] MRN: [id number] Date of Birth: Mar 25, 1928  Date of referral:  01/22/16               Reason for consult:  Family Concerns                Permission sought to share information with:  Family Supports, Customer service manager Permission granted to share information::  Yes, Verbal Permission Granted  Name::     Franchot [place] [phone], all siblings and neices and grandaughters  Agency::  [facility]  Relationship::  yes  Contact Information:  yes  Housing/Transportation Living arrangements for the past 2 months:  [place] of Information:  Adult Children Patient Interpreter Needed:  None Criminal Activity/Legal Involvement Pertinent to Current Situation/Hospitalization:  No - Comment as needed Significant Relationships:  Adult Children, [place], Siblings Lives with:  Facility Resident Do you feel safe going back to the place where you live?  Yes Need for family participation in patient care:  Yes (Comment)  Care giving concerns:  The family wishes the patient to remain comfortable   Social Worker assessment / plan: LCSW was unable to speak with the patient as she is on vent. Met with several of her relatives to collect information to complete assessments. She has medicare and Medicaid and resides at [facility] and will return there if possible. Family reported she eats her food very quickly then aspirates and chokes. Patient relys on walker and wheelchair and needs assistance to ambulate. She is oriented to her herself but gets a bit confused at times. Verbal consent was received through patients [facility] [phone]. Patient is diabetic yet eats a normal diet,she has good vision and hearing and speech, however as patient on vent unable to talk and is not alert. LCSW plan is to support the several family members present. According to ICU nurse and doctor the prognosis is not  good as she continues to aspirate. LCSW will continue to monitor and assist family members as required.   Employment status:  Retired Forensic scientist:  Information systems manager, [employer] [place] PT Recommendations:  [place] / Referral to community resources:  Other (Comment Required) (Family members not a patient with early stages of dementia-Literature was provided to family)  Patient/Family's Response to care: They understand her end may be near but want to be assured she is getting the best care and is comfortable  Patient/Family's Understanding of and Emotional Response to Diagnosis, Current Treatment, and Prognosis: Most family members understand the current situation and prognosis. They will be consulting  with doctor today.  Emotional Assessment Appearance:  Appears stated age Attitude/Demeanor/Rapport:  Unable to Assess Affect (typically observed):  Unable to Assess Orientation:  Oriented to Self, Oriented to Place, Fluctuating Orientation (Suspected and/or reported Sundowners) Alcohol / Substance use:  Tobacco Use Psych involvement (Current and /or in the community):  No (Comment)  Discharge Needs  Concerns to be addressed:  Adjustment to Illness Readmission within the last 30 days:  No Current discharge risk:  None Barriers to Discharge:  No Barriers Identified   Joana Reamer, LCSW 01/23/2016, 8:17 AM

## 2016-01-23 NOTE — Progress Notes (Signed)
Pt hypertensive, holding PRN HTN medications, just administered Fentanyl.

## 2016-01-24 ENCOUNTER — Inpatient Hospital Stay: Payer: Medicare Other

## 2016-01-24 LAB — BLOOD GAS, ARTERIAL
Acid-base deficit: 1.5 mmol/L (ref 0.0–2.0)
Bicarbonate: 22.8 mEq/L (ref 21.0–28.0)
FIO2: 0.28
MECHANICAL RATE: 14
O2 SAT: 97.2 %
PATIENT TEMPERATURE: 37
PEEP/CPAP: 5 cmH2O
PO2 ART: 92 mmHg (ref 83.0–108.0)
VT: 500 mL
pCO2 arterial: 36 mmHg (ref 32.0–48.0)
pH, Arterial: 7.41 (ref 7.350–7.450)

## 2016-01-24 LAB — BASIC METABOLIC PANEL
Anion gap: 9 (ref 5–15)
BUN: 26 mg/dL — ABNORMAL HIGH (ref 6–20)
CHLORIDE: 102 mmol/L (ref 101–111)
CO2: 23 mmol/L (ref 22–32)
CREATININE: 1.35 mg/dL — AB (ref 0.44–1.00)
Calcium: 8.2 mg/dL — ABNORMAL LOW (ref 8.9–10.3)
GFR, EST AFRICAN AMERICAN: 40 mL/min — AB (ref >60)
GFR, EST NON AFRICAN AMERICAN: 35 mL/min — AB (ref >60)
Glucose, Bld: 255 mg/dL — ABNORMAL HIGH (ref 65–99)
POTASSIUM: 3.3 mmol/L — AB (ref 3.5–5.1)
SODIUM: 134 mmol/L — AB (ref 135–145)

## 2016-01-24 LAB — GLUCOSE, CAPILLARY
GLUCOSE-CAPILLARY: 150 mg/dL — AB (ref 65–99)
GLUCOSE-CAPILLARY: 156 mg/dL — AB (ref 65–99)
GLUCOSE-CAPILLARY: 197 mg/dL — AB (ref 65–99)
GLUCOSE-CAPILLARY: 208 mg/dL — AB (ref 65–99)
GLUCOSE-CAPILLARY: 247 mg/dL — AB (ref 65–99)
Glucose-Capillary: 162 mg/dL — ABNORMAL HIGH (ref 65–99)

## 2016-01-24 LAB — CBC
HCT: 31.3 % — ABNORMAL LOW (ref 35.0–47.0)
HEMOGLOBIN: 10.6 g/dL — AB (ref 12.0–16.0)
MCH: 32 pg (ref 26.0–34.0)
MCHC: 33.7 g/dL (ref 32.0–36.0)
MCV: 95 fL (ref 80.0–100.0)
Platelets: 157 10*3/uL (ref 150–440)
RBC: 3.3 MIL/uL — AB (ref 3.80–5.20)
RDW: 14.6 % — ABNORMAL HIGH (ref 11.5–14.5)
WBC: 11.2 10*3/uL — ABNORMAL HIGH (ref 3.6–11.0)

## 2016-01-24 LAB — MAGNESIUM: MAGNESIUM: 1.9 mg/dL (ref 1.7–2.4)

## 2016-01-24 MED ORDER — POTASSIUM CHLORIDE CRYS ER 20 MEQ PO TBCR
20.0000 meq | EXTENDED_RELEASE_TABLET | Freq: Once | ORAL | Status: AC
  Administered 2016-01-24: 20 meq via ORAL
  Filled 2016-01-24: qty 1

## 2016-01-24 NOTE — Progress Notes (Signed)
Notified NP Magdalene about pts hypertensive status post PRN admin of hydralazine 10 more one time hydralazine ordered. Will reassess.

## 2016-01-24 NOTE — Progress Notes (Signed)
Temp 102.3 1 hr post ibuprofen admin. Per NP Magdalene place on cooling blanket and order repeat blood cultures. Will reassess.

## 2016-01-24 NOTE — Consult Note (Addendum)
CC: cardiac arrest   HPI: [name] is an 82 y.o. female admitted from [facility] via EMS due cardiac arrest. Pt was in asystole for about 15 minutes.   No improvement on examination.   Past Medical History  Diagnosis Date  . Dementia   . Hypertension   . Renal disorder   . Diabetes mellitus without complication (HCC)   . COPD (chronic obstructive pulmonary disease) (HCC)   . GERD (gastroesophageal reflux disease)     History reviewed. No pertinent past surgical history.  History reviewed. No pertinent family history.  Social History:  reports that she has been smoking.  She does not have any smokeless tobacco history on file. She reports that she does not drink alcohol. Her drug history is not on file.  No Known Allergies  Medications: I have reviewed the patient's current medications.  ROS: Not able to obtain as not following commands.   Physical Examination: Blood pressure 128/70, pulse 104, temperature 98.8 F (37.1 C), temperature source Core (Comment), resp. rate 26, height  (1.6 m), weight 199 lb 8.3 oz (90.5 kg), SpO2 98 %.  Intubated. Not following commands Sluggish puils Tripple flexion of Lower extremities No withdrawal from painful stimuli.   Laboratory Studies:   Basic Metabolic Panel:  Recent Labs Lab 01/21/16 1859 01/21/16 2306 01/22/16 0522 01/23/16 0602 01/24/16 0447  NA 137  --  138 132* 134*  K 3.9  --  4.3 3.7 3.3*  CL 106  --  110 103 102  CO2 17*  --  GLUCOSE 305*  --  232* 159* 255*  BUN 25*  --  25* 23* 26*  CREATININE 1.30* 1.07* 0.96 1.19* 1.35*  CALCIUM 8.4*  --  7.6* 7.7* 8.2*  MG  --   --   --  1.7 1.9  PHOS  --   --   --  2.3*  --     Liver Function Tests:  Recent Labs Lab 01/21/16 1859 01/23/16 0602  AST 41 63*  ALT 18 27  ALKPHOS 64 44  BILITOT 0.3 0.7  PROT 7.2 6.7  ALBUMIN 3.4* 3.2*   No results for input(s): LIPASE, AMYLASE in the last 168 hours. No results for input(s):  AMMONIA in the last 168 hours.  CBC:  Recent Labs Lab 01/21/16 1859 01/21/16 2306 01/22/16 0522 01/23/16 0602 01/24/16 0447  WBC 7.9 11.5* 11.5* 10.6 11.2*  NEUTROABS 2.6  --   --   --   --   HGB 11.5* 11.4* 11.0* 10.4* 10.6*  HCT 36.0 34.6* 32.8* 30.9* 31.3*  MCV 100.2* 94.8 94.9 92.8 95.0  PLT 196 202 186 162 157    Cardiac Enzymes:  Recent Labs Lab 01/21/16 1859 01/21/16 2306 01/22/16 0522 01/22/16 1056  TROPONINI 0.03 0.28* 0.80* 0.79*    BNP: Invalid input(s): POCBNP  CBG:  Recent Labs Lab 01/23/16 2006 01/24/16 0001 01/24/16 0409 01/24/16 0721 01/24/16 1131  GLUCAP 160* 197* 247* 208* 156*    Microbiology: Results for orders placed or performed during the hospital encounter of 01/21/16  MRSA PCR Screening     Status: None   Collection Time: 01/21/16 10:39 PM  Result Value Ref Range Status   MRSA by PCR NEGATIVE NEGATIVE Final    Comment:        The GeneXpert MRSA Assay (FDA approved for NASAL specimens only), is one component of a comprehensive MRSA colonization surveillance program. It is not intended to diagnose MRSA infection  nor to guide or monitor treatment for MRSA infections.   Culture, expectorated sputum-assessment     Status: None   Collection Time: 01/23/16 11:33 AM  Result Value Ref Range Status   Specimen Description EXPECTORATED SPUTUM  Final   Special Requests NONE  Final   Sputum evaluation THIS SPECIMEN IS ACCEPTABLE FOR SPUTUM CULTURE  Final   Report Status 01/23/2016 FINAL  Final  Culture, respiratory (NON-Expectorated)     Status: None (Preliminary result)   Collection Time: 01/23/16 11:33 AM  Result Value Ref Range Status   Specimen Description EXPECTORATED SPUTUM  Final   Special Requests NONE Reflexed from [id number]  Final   Gram Stain   Final    FEW WBC SEEN MANY GRAM POSITIVE RODS RARE GRAM POSITIVE COCCI    Culture HOLDING FOR POSSIBLE PATHOGEN  Final   Report Status PENDING  Incomplete  CULTURE, BLOOD  (ROUTINE X 2) w Reflex to PCR ID Panel     Status: None (Preliminary result)   Collection Time: 01/24/16  4:46 AM  Result Value Ref Range Status   Specimen Description BLOOD LEFT ARM  Final   Special Requests   Final    BOTTLES DRAWN AEROBIC AND ANAEROBIC 10CCAERO,10CCANA   Culture NO GROWTH < 12 HOURS  Final   Report Status PENDING  Incomplete  CULTURE, BLOOD (ROUTINE X 2) w Reflex to PCR ID Panel     Status: None (Preliminary result)   Collection Time: 01/24/16  4:47 AM  Result Value Ref Range Status   Specimen Description BLOOD RIGHT HAND  Final   Special Requests BOTTLES DRAWN AEROBIC AND ANAEROBIC 8CCAERO,8CCANA  Final   Culture NO GROWTH < 12 HOURS  Final   Report Status PENDING  Incomplete    Coagulation Studies: No results for input(s): LABPROT, INR in the last 72 hours.  Urinalysis: No results for input(s): COLORURINE, LABSPEC, PHURINE, GLUCOSEU, HGBUR, BILIRUBINUR, KETONESUR, PROTEINUR, UROBILINOGEN, NITRITE, LEUKOCYTESUR in the last 168 hours.  Invalid input(s): APPERANCEUR  Lipid Panel:  No results found for: CHOL, TRIG, HDL, CHOLHDL, VLDL, LDLCALC  HgbA1C: No results found for: HGBA1C  Urine Drug Screen:      Component Value Date/Time   LABOPIA NEGATIVE 02/17/2012 1130   LABBENZ NEGATIVE 02/17/2012 1130   AMPHETMU NEGATIVE 02/17/2012 1130   THCU NEGATIVE 02/17/2012 1130   LABBARB NEGATIVE 02/17/2012 1130    Alcohol Level: No results for input(s): ETH in the last 168 hours.  Other results: EKG: normal EKG, normal sinus rhythm, unchanged from previous tracings.  Imaging: Dg Chest 1 View  01/24/2016  CLINICAL DATA:  82 year old female with history of dyspnea. EXAM: CHEST 1 VIEW COMPARISON:  Chest x-ray 01/23/2016. FINDINGS: An endotracheal tube is in place with tip 3.5 cm above the carina. A nasogastric tube is seen extending into the stomach, however, the tip of the nasogastric tube extends below the lower margin of the image. Lung volumes are low. Diffuse  peribronchial cuffing. Interstitial prominence throughout the lungs bilaterally. Scattered linear opacities may reflect areas of subsegmental atelectasis or scarring. No evidence of pulmonary edema. Heart size is normal. Upper mediastinal contours are distorted by patient positioning. Atherosclerotic calcifications in the thoracic aorta. IMPRESSION: 1. Support apparatus, as above. 2. Low lung volumes with diffuse peribronchial cuffing and mild diffuse interstitial prominence. These findings raise concern for potential interstitial lung disease, but are not definitive. This could alternatively be seen in the setting of bronchitis. After resolution of the patient's acute illness, followup nonemergent high-resolution chest CT is  suggested in the near future if there is clinical concern for interstitial lung disease. 3. Atherosclerosis. Electronically Signed   By: Trudie Reed M.D.   On: 01/24/2016 08:31   Dg Chest Port 1 View  01/23/2016  CLINICAL DATA:  Respiratory failure. EXAM: PORTABLE CHEST 1 VIEW COMPARISON:  01/21/2016. FINDINGS: Endotracheal tube tip 1.5 cm above the carina. Nasogastric tube extending into the stomach. Normal sized heart. Prominent interstitial markings with improvement. No pleural fluid. Diffuse osteopenia. IMPRESSION: Mild to moderate chronic interstitial lung disease. No acute abnormality. Electronically Signed   By: Beckie Salts M.D.   On: 01/23/2016 07:31     Assessment/Plan:  82 y.o. female admitted from [facility] via EMS due cardiac arrest. Pt was in asystole for about 15 minutes.  No improvement on examination. Pt still does not follow commands, but triple flexion in lower extremities.   Repeat CTH shows early signs of anoxic injury.   S/p discussion with family.  Further evaluation to follow.    Pauletta Browns  01/24/2016, 1:58 PM

## 2016-01-25 ENCOUNTER — Inpatient Hospital Stay: Payer: Medicare Other

## 2016-01-25 DIAGNOSIS — G931 Anoxic brain damage, not elsewhere classified: Secondary | ICD-10-CM | POA: Insufficient documentation

## 2016-01-25 LAB — BLOOD GAS, ARTERIAL
Acid-Base Excess: 3.1 mmol/L — ABNORMAL HIGH (ref 0.0–3.0)
BICARBONATE: 27 meq/L (ref 21.0–28.0)
FIO2: 0.28
MECHANICAL RATE: 14
MECHVT: 500 mL
O2 Saturation: 97.4 %
PEEP: 5 cmH2O
Patient temperature: 37
pCO2 arterial: 38 mmHg (ref 32.0–48.0)
pH, Arterial: 7.46 — ABNORMAL HIGH (ref 7.350–7.450)
pO2, Arterial: 90 mmHg (ref 83.0–108.0)

## 2016-01-25 LAB — CBC
HEMATOCRIT: 30.3 % — AB (ref 35.0–47.0)
HEMOGLOBIN: 10.6 g/dL — AB (ref 12.0–16.0)
MCH: 32.3 pg (ref 26.0–34.0)
MCHC: 35 g/dL (ref 32.0–36.0)
MCV: 92.2 fL (ref 80.0–100.0)
Platelets: 173 10*3/uL (ref 150–440)
RBC: 3.29 MIL/uL — ABNORMAL LOW (ref 3.80–5.20)
RDW: 14.5 % (ref 11.5–14.5)
WBC: 10 10*3/uL (ref 3.6–11.0)

## 2016-01-25 LAB — ECHOCARDIOGRAM COMPLETE
HEIGHTINCHES: 63 in
Weight: 3200 oz

## 2016-01-25 LAB — BASIC METABOLIC PANEL
Anion gap: 7 (ref 5–15)
BUN: 21 mg/dL — AB (ref 6–20)
CALCIUM: 8.4 mg/dL — AB (ref 8.9–10.3)
CO2: 24 mmol/L (ref 22–32)
CREATININE: 0.87 mg/dL (ref 0.44–1.00)
Chloride: 104 mmol/L (ref 101–111)
GFR calc non Af Amer: 59 mL/min — ABNORMAL LOW (ref >60)
Glucose, Bld: 183 mg/dL — ABNORMAL HIGH (ref 65–99)
Potassium: 3.7 mmol/L (ref 3.5–5.1)
Sodium: 135 mmol/L (ref 135–145)

## 2016-01-25 LAB — GLUCOSE, CAPILLARY
GLUCOSE-CAPILLARY: 146 mg/dL — AB (ref 65–99)
GLUCOSE-CAPILLARY: 165 mg/dL — AB (ref 65–99)
GLUCOSE-CAPILLARY: 168 mg/dL — AB (ref 65–99)
GLUCOSE-CAPILLARY: 186 mg/dL — AB (ref 65–99)
GLUCOSE-CAPILLARY: 188 mg/dL — AB (ref 65–99)
GLUCOSE-CAPILLARY: 191 mg/dL — AB (ref 65–99)

## 2016-01-25 NOTE — Progress Notes (Signed)
Pharmacy Antibiotic Note  [name] is a 82 y.o. female admitted on 01/21/2016 with pneumonia.  Pharmacy has been consulted for Zosyn and vancomycin dosing. Vancomycin d/c, MRSA PCR negative.   Plan: Will continue Zosyn 3.375 gm IV Q8H EI.    Height:  (160 cm) Weight: 202 lb 13.2 oz (92 kg) IBW/kg (Calculated) : 52.4  Temp (24hrs), Avg:98.8 F (37.1 C), Min:97.7 F (36.5 C), Max:100.4 F (38 C)   Recent Labs Lab 01/21/16 2109 01/21/16 2306 01/22/16 0522 01/23/16 0602 01/24/16 0447 01/25/16 0458  WBC  --  11.5* 11.5* 10.6 11.2* 10.0  CREATININE  --  1.07* 0.96 1.19* 1.35* 0.87  LATICACIDVEN 5.9* 4.7*  --   --   --   --     Estimated Creatinine Clearance: 50.9 mL/min (by C-G formula based on Cr of 0.87).    No Known Allergies   Results for orders placed or performed during the hospital encounter of 01/21/16  MRSA PCR Screening     Status: None   Collection Time: 01/21/16 10:39 PM  Result Value Ref Range Status   MRSA by PCR NEGATIVE NEGATIVE Final    Comment:        The GeneXpert MRSA Assay (FDA approved for NASAL specimens only), is one component of a comprehensive MRSA colonization surveillance program. It is not intended to diagnose MRSA infection nor to guide or monitor treatment for MRSA infections.   Culture, expectorated sputum-assessment     Status: None   Collection Time: 01/23/16 11:33 AM  Result Value Ref Range Status   Specimen Description EXPECTORATED SPUTUM  Final   Special Requests NONE  Final   Sputum evaluation THIS SPECIMEN IS ACCEPTABLE FOR SPUTUM CULTURE  Final   Report Status 01/23/2016 FINAL  Final  Culture, respiratory (NON-Expectorated)     Status: None (Preliminary result)   Collection Time: 01/23/16 11:33 AM  Result Value Ref Range Status   Specimen Description EXPECTORATED SPUTUM  Final   Special Requests NONE Reflexed from [id number]  Final   Gram Stain   Final    FEW WBC SEEN MANY GRAM POSITIVE RODS RARE GRAM POSITIVE  COCCI    Culture HOLDING FOR POSSIBLE PATHOGEN  Final   Report Status PENDING  Incomplete  CULTURE, BLOOD (ROUTINE X 2) w Reflex to PCR ID Panel     Status: None (Preliminary result)   Collection Time: 01/24/16  4:46 AM  Result Value Ref Range Status   Specimen Description BLOOD LEFT ARM  Final   Special Requests   Final    BOTTLES DRAWN AEROBIC AND ANAEROBIC 10CCAERO,10CCANA   Culture NO GROWTH 1 DAY  Final   Report Status PENDING  Incomplete  CULTURE, BLOOD (ROUTINE X 2) w Reflex to PCR ID Panel     Status: None (Preliminary result)   Collection Time: 01/24/16  4:47 AM  Result Value Ref Range Status   Specimen Description BLOOD RIGHT HAND  Final   Special Requests BOTTLES DRAWN AEROBIC AND ANAEROBIC 8CCAERO,8CCANA  Final   Culture NO GROWTH 1 DAY  Final   Report Status PENDING  Incomplete   Anti-infectives    Start     Dose/Rate Route Frequency Ordered Stop   01/22/16 1200  vancomycin (VANCOCIN) IVPB 1000 mg/200 mL premix  Status:  Discontinued     1,000 mg 200 mL/hr over 60 Minutes Intravenous Every 24 hours 01/21/16 2250 01/22/16 1004   01/22/16 0600  piperacillin-tazobactam (ZOSYN) IVPB 3.375 g     3.375 g 12.5  mL/hr over 240 Minutes Intravenous 3 times per day 01/21/16 2250     01/21/16 2230  piperacillin-tazobactam (ZOSYN) IVPB 3.375 g     3.375 g 100 mL/hr over 30 Minutes Intravenous  Once 01/21/16 2220 01/21/16 2341   01/21/16 2230  vancomycin (VANCOCIN) IVPB 1000 mg/200 mL premix     1,000 mg 200 mL/hr over 60 Minutes Intravenous  Once 01/21/16 2227 01/22/16 0011      Thank you for allowing pharmacy to be a part of this patient's care.  Luisa Harthristy, Zalea Pete D, Pharm.D., BCPS Clinical Pharmacist 01/25/2016 12:26 PM

## 2016-01-25 NOTE — Progress Notes (Signed)
EEG completed, results pending. 

## 2016-01-25 NOTE — Progress Notes (Signed)
Family discussion held. Patient showing signs of severe anoxic brain injury I have updated daughter and daughter in law.  After discussion, will place DNR orders. The family will decide on comfort care care measures within 1-2 days.   Will discuss case with Neurologist.  I will not need for further testing at this time.  Family is satisfied with conversation.    Patient/Family are satisfied with Plan of action and management. All questions answered  Lucie LeatherKurian David Merwin Breden, M.D.  [facility]  Medical Director [facility] Medical Director [facility]

## 2016-01-25 NOTE — Progress Notes (Signed)
Notified Dr. Thad Rangereynolds that patient withdraws to pain- eyes 2 plus and sluggish- eye move from right to left constantly.  Patient breathes over the vent and withdraws her feet and legs when I rubbed my finger up the bottom of her feet.  No new orders.

## 2016-01-25 NOTE — Progress Notes (Signed)
Chaplain rounded the unit and provided a compassionate presence with support through silent prayer to the patient. Chaplain Uri Covey [phone] 

## 2016-01-25 NOTE — Progress Notes (Signed)
Nutrition Follow-up  DOCUMENTATION CODES:   Obesity unspecified  INTERVENTION:   Coordination of Care: discussed nutritional poc during ICU rounds, MD Kasa plans to discuss comfort care measures with pt family today, therefore initiation of TF not appropriate at this time. TF orders remain active and can be started if poc changes and medically able. Continue to assess   NUTRITION DIAGNOSIS:   Inadequate oral intake related to acute illness as evidenced by NPO status. Being addressed via TF  GOAL:   Provide needs based on ASPEN/SCCM guidelines  MONITOR:   TF tolerance, Vent status, Labs, Weight trends  REASON FOR ASSESSMENT:   Ventilator, Consult Enteral/tube feeding initiation and management  ASSESSMENT:   82 yo female admitted with acute respiratory failure s/p cardiac arrest requiring intubation on 01/21/16, large piece of meat suctioned from airway during intubation. Hypothermic protocol not initiated  Pt remains on vent  Diet Order:   NPO  EN: TF remains on hold  Digestive System: abdominal xray consistent with adynamic ileus of colon, +BM  Skin:  Reviewed, no issues  Last BM:  01/24/16   Labs:  Glucose Profile:   Recent Labs  01/25/16 0002 01/25/16 0357 01/25/16 1157  GLUCAP 188* 168* 165*   Meds: ss novolog  Height:   Ht Readings from Last 1 Encounters:  01/21/16 5\' 3"  (1.6 m)    Weight:   Wt Readings from Last 1 Encounters:  01/25/16 202 lb 13.2 oz (92 kg)   Filed Weights   01/23/16 0500 01/24/16 0543 01/25/16 0500  Weight: 210 lb 12.2 oz (95.6 kg) 199 lb 8.3 oz (90.5 kg) 202 lb 13.2 oz (92 kg)    BMI:  Body mass index is 35.94 kg/(m^2).  Estimated Nutritional Needs:   Kcal:  1000-1275 kcals (11-14 kcals/kg) per ASPEN guidelines for BMI >30  Protein:  104-130 g (1.5-2.0 g/kg)   Fluid:  >1.5 L  EDUCATION NEEDS:   Education needs no appropriate at this time  Romelle StarcherCate Yarethzi Branan MS, RD, LDN [phone]) [phone] Pager  [phone]) [phone]  Weekend/On-Call Pager

## 2016-01-25 NOTE — Progress Notes (Signed)
PULMONARY / CRITICAL CARE MEDICINE   Name: [name] MRN: [id number] DOB: May 30, 1928    ADMISSION DATE:  01/21/2016  PT PROFILE:   51 F NH resident with COPD, dementia and chronic debilitation who was witnessed to aspirate while eating and suffered cardiac arrest requiring 10-15 mins ACLS  MAJOR EVENTS/TEST RESULTS: 04/13 CT head: No acute intracranial pathology seen on CT. Mild to moderate cortical volume loss and scattered small vessel ischemic microangiopathy. Chronic lacunar infarcts at the basal ganglia bilaterally, and at the right thalamus  04/13 CTAP: likely ileus  04/14 TTE: Results pending. 04/14 EEG: Results pending. 04/14 Neurology Consultation: Pending.  INDWELLING DEVICES:: ETT 04/13 >>    MICRO DATA: MRSA PCR 04/13 >> NEG   ANTIMICROBIALS:  Pip-tazo 04/13 >>    SUBJECTIVE/HISTORY OF PRESENT ILLNESS:   Patient cannot provide a review of systems or history of present illness due to unresponsiveness as she is on the ventilator.    VITAL SIGNS: BP 149/65 mmHg  Pulse 82  Temp(Src) 97.7 F (36.5 C) (Core (Comment))  Resp 21  Ht  (1.6 m)  Wt 202 lb 13.2 oz (92 kg)  BMI 35.94 kg/m2  SpO2 97%  HEMODYNAMICS:    VENTILATOR SETTINGS: Vent Mode:  [-] PRVC FiO2 (%):  [28 %] 28 % Set Rate:  [14 bmp] 14 bmp Vt Set:  [500 mL] 500 mL PEEP:  [5 cmH20] 5 cmH20  INTAKE / OUTPUT: I/O last 3 completed shifts: In: 250 [IV Piggyback:250] Out: 1400 [Urine:1400]  PHYSICAL EXAMINATION: General: patient remains obtunded with response to only painful stimuli Neuro: obtunded, responds to painful stimuli HEENT:Atraumatic, normocephalic, conjugate pupils.  Cardiovascular:S1S2, NO mrg Lungs: Scattered rhonchi, no wheezes, crackles Abdomen: obese, soft, +BS Ext: warm, no edema  LABS:  BMET  Recent Labs Lab 01/23/16 0602 01/24/16 0447 01/25/16 0458  NA 132* 134* 135  K 3.7 3.3* 3.7  CL 103 102 104  CO2 BUN 23* 26* 21*  CREATININE 1.19*  1.35* 0.87  GLUCOSE 159* 255* 183*    Electrolytes  Recent Labs Lab 01/23/16 0602 01/24/16 0447 01/25/16 0458  CALCIUM 7.7* 8.2* 8.4*  MG 1.7 1.9  --   PHOS 2.3*  --   --     CBC  Recent Labs Lab 01/23/16 0602 01/24/16 0447 01/25/16 0458  WBC 10.6 11.2* 10.0  HGB 10.4* 10.6* 10.6*  HCT 30.9* 31.3* 30.3*  PLT 162 157 173    Coag's No results for input(s): APTT, INR in the last 168 hours.  Sepsis Markers  Recent Labs Lab 01/21/16 2109 01/21/16 2306  LATICACIDVEN 5.9* 4.7*    ABG  Recent Labs Lab 01/23/16 0433 01/24/16 0520 01/25/16 0505  PHART 7.44 7.41 7.46*  PCO2ART 36 36 38  PO2ART 78* 92 90    Liver Enzymes  Recent Labs Lab 01/21/16 1859 01/23/16 0602  AST 41 63*  ALT 18 27  ALKPHOS 64 44  BILITOT 0.3 0.7  ALBUMIN 3.4* 3.2*    Cardiac Enzymes  Recent Labs Lab 01/21/16 2306 01/22/16 0522 01/22/16 1056  TROPONINI 0.28* 0.80* 0.79*    Glucose  Recent Labs Lab 01/24/16 0721 01/24/16 1131 01/24/16 1621 01/24/16 1959 01/25/16 0002 01/25/16 0357  GLUCAP 208* 156* 150* 162* 188* 168*    CXR: cardiomegaly, diffuse interstitial infiltrates c/w edema    ASSESSMENT / PLAN:  PULMONARY A: VDRF post cardiorespiratory arrest due to aspirated meat P:   Vent settings established Vent bundle implemented Daily SBT as indicated Continue  steroids and bronchodilators   CARDIOVASCULAR A:  S/P cardiac arrest due to aspiration Cardiomegaly on CXR Minimally elevated trop I - likely demand ischemia around time of arrest Aspiration of food, we are continuing to suction food from her lung. P:  Hemodynamic monitoring Echocardiogram The patient may undergo a terminal wean in the next 24-48 hours, therefore we'll hold off on bronchoscopy unless aggressive care is desired. It is unlikely that bronchoscopy with airway clearance will meaningfully improve her outcome , given her poor very poor prognosis and unresponsive state at this  time.  RENAL A:   No issues P:  Monitor BMET intermittently Monitor I/Os Correct electrolytes as indicated  GASTROINTESTINAL A:   Obesity Possible ileus (by CTAP - unclear why this test was performed) P:   SUP: enteral PPI Begin TFs AM 04/14  HEMATOLOGIC A:   No issues P:  Continue : SQ heparin Monitor CBC intermittently Transfuse per usual guidelines  INFECTIOUS A:   Aspiration - possible PNA P:    Continue to Monitor temp, WBC count  continue Zosyn for now  ENDOCRINE A:   DM 2 with hyperglycemia P:   Continue SSI  NEUROLOGIC A:   Post arrest anoxic encephalopathy Prognosis guarded Onsedation, the patient continues to be unresponsive. P:   RASS goal: 0, -1 EEG 04/14 indicative of anoxic brain injury Neuro consult appreciated, family made aware of the poor prognosis      Bincy Varughese,AG-ACNP Pulmonary & Critical Care   STAFF NOTE: I, Dr. Lucie LeatherKurian David Geovanie Winnett,  have personally reviewed patient's available data, including medical history, events of note, physical examination and test results as part of my evaluation. I have discussed with NP and other care providers such as pharmacist, RN and RRT.  In addition,  I personally evaluated patient and elicited key findings  Rhonchi on lung exam, obtunded GCS 8T   A:acute resp failure from aspiration  P:possible terminal wean in next 24-48 hrs    The Rest per NP whose note is outlined above and that I agree with  I have personally reviewed/obtained a history, examined the patient, evaluated Pertinent laboratory and RadioGraphic/imaging results, and  formulated the assessment and plan   The Patient requires high complexity decision making for assessment and support, frequent evaluation and titration of therapies, application of advanced monitoring technologies and extensive interpretation of multiple databases. Critical Care Time devoted to patient care services described in this note is 35 minutes.   This Critical care time does not reflrect procedure time or supervisory time of NP but could involve care discussion time Overall, patient is critically ill, prognosis is guarded.  Patient with Multiorgan failure and at high risk for cardiac arrest and death.    Lucie LeatherKurian David Jeweldean Drohan, M.D.  [facility]  Medical Director [facility] Medical Director [facility]

## 2016-01-25 NOTE — Progress Notes (Signed)
Subjective: Patient remains unresponsive but nursing and family feels that there may be some purposeful responses.  Patient breathing over the ventilator.    Objective: Current vital signs: BP 153/66 mmHg  Pulse 79  Temp(Src) 99 F (37.2 C) (Rectal)  Resp 20  Ht 5' 3"  (1.6 m)  Wt 92 kg (202 lb 13.2 oz)  BMI 35.94 kg/m2  SpO2 97% Vital signs in last 24 hours: Temp:  [97.7 F (36.5 C)-100.4 F (38 C)] 99 F (37.2 C) (04/17 0740) Pulse Rate:  [79-99] 79 (04/17 0800) Resp:  [15-28] 20 (04/17 0800) BP: (140-170)/(57-80) 153/66 mmHg (04/17 0800) SpO2:  [96 %-99 %] 97 % (04/17 0800) FiO2 (%):  [28 %] 28 % (04/17 0800) Weight:  [92 kg (202 lb 13.2 oz)] 92 kg (202 lb 13.2 oz) (04/17 0500)  Intake/Output from previous day: 04/16 0701 - 04/17 0700 In: 150 [IV Piggyback:150] Out: 925 [Urine:925] Intake/Output this shift:   Nutritional status:    Neurologic Exam: Mental Status: Patient does not respond to verbal stimuli.  With deep sternal rub eyes open and on one occasion some flexion movement was noted in the RUE.   Does not follow commands.  No verbalizations noted.  Cranial Nerves: II: patient does not respond confrontation bilaterally, pupils right 3 mm, left 3 mm,and reactive bilaterally III,IV,VI: doll's response present bilaterally. Eye movements roaming. V,VII: corneal reflex present bilaterally  VIII: patient does not respond to verbal stimuli IX,X: gag reflex present, XI: trapezius strength unable to test bilaterally XII: tongue strength unable to test Motor: Increased tone in the RUE and RLE Sensory: Does not respond to noxious stimuli in the upper extremities.  In the lower extremities triple reflex noted with noxious stimuli bilaterally Deep Tendon Reflexes:  3+ on the right and 1+ on the left Plantars: upgoing bilaterally Cerebellar: Unable to perform    Lab Results: Basic Metabolic Panel:  Recent Labs Lab 01/21/16 1859 01/21/16 2306 01/22/16 0522  01/23/16 0602 01/24/16 0447 01/25/16 0458  NA 137  --  138 132* 134* 135  K 3.9  --  4.3 3.7 3.3* 3.7  CL 106  --  110 103 102 104  CO2 17*  --  22 23 23 24   GLUCOSE 305*  --  232* 159* 255* 183*  BUN 25*  --  25* 23* 26* 21*  CREATININE 1.30* 1.07* 0.96 1.19* 1.35* 0.87  CALCIUM 8.4*  --  7.6* 7.7* 8.2* 8.4*  MG  --   --   --  1.7 1.9  --   PHOS  --   --   --  2.3*  --   --     Liver Function Tests:  Recent Labs Lab 01/21/16 1859 01/23/16 0602  AST 41 63*  ALT 18 27  ALKPHOS 64 44  BILITOT 0.3 0.7  PROT 7.2 6.7  ALBUMIN 3.4* 3.2*   No results for input(s): LIPASE, AMYLASE in the last 168 hours. No results for input(s): AMMONIA in the last 168 hours.  CBC:  Recent Labs Lab 01/21/16 1859 01/21/16 2306 01/22/16 0522 01/23/16 0602 01/24/16 0447 01/25/16 0458  WBC 7.9 11.5* 11.5* 10.6 11.2* 10.0  NEUTROABS 2.6  --   --   --   --   --   HGB 11.5* 11.4* 11.0* 10.4* 10.6* 10.6*  HCT 36.0 34.6* 32.8* 30.9* 31.3* 30.3*  MCV 100.2* 94.8 94.9 92.8 95.0 92.2  PLT 196 202 186 162 157 173    Cardiac Enzymes:  Recent Labs Lab 01/21/16  1859 01/21/16 2306 01/22/16 0522 01/22/16 1056  TROPONINI 0.03 0.28* 0.80* 0.79*    Lipid Panel: No results for input(s): CHOL, TRIG, HDL, CHOLHDL, VLDL, LDLCALC in the last 168 hours.  CBG:  Recent Labs Lab 01/24/16 1131 01/24/16 1621 01/24/16 1959 01/25/16 0002 01/25/16 0357  GLUCAP 156* 150* 162* 188* 168*    Microbiology: Results for orders placed or performed during the hospital encounter of 01/21/16  MRSA PCR Screening     Status: None   Collection Time: 01/21/16 10:39 PM  Result Value Ref Range Status   MRSA by PCR NEGATIVE NEGATIVE Final    Comment:        The GeneXpert MRSA Assay (FDA approved for NASAL specimens only), is one component of a comprehensive MRSA colonization surveillance program. It is not intended to diagnose MRSA infection nor to guide or monitor treatment for MRSA infections.    Culture, expectorated sputum-assessment     Status: None   Collection Time: 01/23/16 11:33 AM  Result Value Ref Range Status   Specimen Description EXPECTORATED SPUTUM  Final   Special Requests NONE  Final   Sputum evaluation THIS SPECIMEN IS ACCEPTABLE FOR SPUTUM CULTURE  Final   Report Status 01/23/2016 FINAL  Final  Culture, respiratory (NON-Expectorated)     Status: None (Preliminary result)   Collection Time: 01/23/16 11:33 AM  Result Value Ref Range Status   Specimen Description EXPECTORATED SPUTUM  Final   Special Requests NONE Reflexed from [id number]  Final   Gram Stain   Final    FEW WBC SEEN MANY GRAM POSITIVE RODS RARE GRAM POSITIVE COCCI    Culture HOLDING FOR POSSIBLE PATHOGEN  Final   Report Status PENDING  Incomplete  CULTURE, BLOOD (ROUTINE X 2) w Reflex to PCR ID Panel     Status: None (Preliminary result)   Collection Time: 01/24/16  4:46 AM  Result Value Ref Range Status   Specimen Description BLOOD LEFT ARM  Final   Special Requests   Final    BOTTLES DRAWN AEROBIC AND ANAEROBIC 10CCAERO,10CCANA   Culture NO GROWTH < 12 HOURS  Final   Report Status PENDING  Incomplete  CULTURE, BLOOD (ROUTINE X 2) w Reflex to PCR ID Panel     Status: None (Preliminary result)   Collection Time: 01/24/16  4:47 AM  Result Value Ref Range Status   Specimen Description BLOOD RIGHT HAND  Final   Special Requests BOTTLES DRAWN AEROBIC AND ANAEROBIC [place]  Final   Culture NO GROWTH < 12 HOURS  Final   Report Status PENDING  Incomplete    Coagulation Studies: No results for input(s): LABPROT, INR in the last 72 hours.  Imaging: Dg Chest 1 View  01/25/2016  CLINICAL DATA:  Dyspnea, cardio respiratory arrest, history of COPD, diabetes, chronic renal disease, and dementia. Current smoker. EXAM: CHEST 1 VIEW COMPARISON:  Portable chest x-ray of January 24, 2016 FINDINGS: The lungs are mildly hypoinflated. The interstitial markings remain coarse but have improved slightly. There  is no significant pleural effusion. The heart is not enlarged. The central pulmonary vascularity is mildly prominent and indistinct. The endotracheal tube tip lies 3.4 cm above the carina. The esophagogastric tube tip projects below the inferior margin of the image. IMPRESSION: Mild interval improvement in pulmonary interstitial edema. Persistent mild hypoinflation without evidence of pneumonia. Electronically Signed   By: David  Martinique M.D.   On: 01/25/2016 07:45   Dg Chest 1 View  01/24/2016  CLINICAL DATA:  82 year old female with history  of dyspnea. EXAM: CHEST 1 VIEW COMPARISON:  Chest x-ray 01/23/2016. FINDINGS: An endotracheal tube is in place with tip 3.5 cm above the carina. A nasogastric tube is seen extending into the stomach, however, the tip of the nasogastric tube extends below the lower margin of the image. Lung volumes are low. Diffuse peribronchial cuffing. Interstitial prominence throughout the lungs bilaterally. Scattered linear opacities may reflect areas of subsegmental atelectasis or scarring. No evidence of pulmonary edema. Heart size is normal. Upper mediastinal contours are distorted by patient positioning. Atherosclerotic calcifications in the thoracic aorta. IMPRESSION: 1. Support apparatus, as above. 2. Low lung volumes with diffuse peribronchial cuffing and mild diffuse interstitial prominence. These findings raise concern for potential interstitial lung disease, but are not definitive. This could alternatively be seen in the setting of bronchitis. After resolution of the patient's acute illness, followup nonemergent high-resolution chest CT is suggested in the near future if there is clinical concern for interstitial lung disease. 3. Atherosclerosis. Electronically Signed   By: Vinnie Langton M.D.   On: 01/24/2016 08:31   Dg Abd 1 View  01/24/2016  CLINICAL DATA:  Ileus on previous abdominal imaging. Last bowel movement on 04/14. Abdominal distention. Patient is on a ventilator  with OG tube present. EXAM: ABDOMEN - 1 VIEW COMPARISON:  CT 01/21/2016 FINDINGS: Diffuse gaseous distention of the colon with scattered gas in nondistended small bowel. Changes most likely to represent adynamic ileus. Scattered stool is also present in the colon and rectum. An enteric tube is present in the upper abdomen consistent with location in the distal stomach. No radiopaque stones. Degenerative changes in the lumbar spine and hips. Vascular calcifications. IMPRESSION: Gaseous distention of colon most likely due to adynamic ileus. Scattered stool in the colon and rectum. Electronically Signed   By: Lucienne Capers M.D.   On: 01/24/2016 22:40   Ct Head Wo Contrast  01/24/2016  CLINICAL DATA:  Patient with dementia. Aspiration and cardiac arrest. EXAM: CT HEAD WITHOUT CONTRAST TECHNIQUE: Contiguous axial images were obtained from the base of the skull through the vertex without intravenous contrast. COMPARISON:  Brain CT 01/21/2016 FINDINGS: Interval loss of gray-white differentiation and hypodensity within the caudate heads and basal ganglia bilaterally. Additionally throughout the peripheral cortex including the frontal, parietal and temporal lobes there is suggestion of loss of gray-white differentiation. Orbits are unremarkable. Paranasal sinuses are well aerated. Mastoid air cells are unremarkable. Calvarium is intact. IMPRESSION: Interval loss of gray-white differentiation and development of edema within the caudate head, bilateral basal ganglia and throughout the cerebral cortex concerning for anoxic brain injury. Electronically Signed   By: Lovey Newcomer M.D.   On: 01/24/2016 15:23    Medications:  I have reviewed the patient's current medications. Scheduled: . amLODipine  5 mg Per Tube Daily  . antiseptic oral rinse  7 mL Mouth Rinse 10 times per day  . chlorhexidine gluconate (SAGE KIT)  15 mL Mouth Rinse BID  . feeding supplement (PRO-STAT SUGAR FREE 64)  30 mL Oral BID  . free water   100 mL Per Tube 3 times per day  . heparin  5,000 Units Subcutaneous 3 times per day  . insulin aspart  0-15 Units Subcutaneous 6 times per day  . pantoprazole sodium  40 mg Per Tube Q1200  . piperacillin-tazobactam (ZOSYN)  IV  3.375 g Intravenous 3 times per day    Assessment/Plan: Patient remains unresponsive.  Head CT repeated on yesterday and reviewed.  It reveals some interval loss of great-white  differentiation and edema in the caudate head, bilateral BG and cortex.  EEG performed on 4/14 shows no cerebral activity.  Concnern is for anoxic brain injury but patient does not fulfill criteria for brain death and nursing has noted purposeful responses.  Family is hopeful but concern is that purposeful recovery may not be able to be achieved.    Recommendations: 1.  Repeat EEG   LOS: 4 days   Alexis Goodell, MD Neurology [phone] 01/25/2016  10:50 AM

## 2016-01-26 DIAGNOSIS — J9690 Respiratory failure, unspecified, unspecified whether with hypoxia or hypercapnia: Secondary | ICD-10-CM | POA: Insufficient documentation

## 2016-01-26 DIAGNOSIS — I469 Cardiac arrest, cause unspecified: Secondary | ICD-10-CM | POA: Insufficient documentation

## 2016-01-26 DIAGNOSIS — J9601 Acute respiratory failure with hypoxia: Secondary | ICD-10-CM

## 2016-01-26 DIAGNOSIS — R404 Transient alteration of awareness: Secondary | ICD-10-CM

## 2016-01-26 DIAGNOSIS — G931 Anoxic brain damage, not elsewhere classified: Secondary | ICD-10-CM

## 2016-01-26 LAB — BLOOD GAS, ARTERIAL
ACID-BASE DEFICIT: 0.5 mmol/L (ref 0.0–2.0)
Allens test (pass/fail): POSITIVE — AB
Bicarbonate: 24.2 mEq/L (ref 21.0–28.0)
FIO2: 0.28
O2 Saturation: 93.8 %
PCO2 ART: 39 mmHg (ref 32.0–48.0)
PEEP: 5 cmH2O
PO2 ART: 70 mmHg — AB (ref 83.0–108.0)
Patient temperature: 37
RATE: 14 resp/min
VT: 500 mL
pH, Arterial: 7.4 (ref 7.350–7.450)

## 2016-01-26 LAB — GLUCOSE, CAPILLARY
GLUCOSE-CAPILLARY: 123 mg/dL — AB (ref 65–99)
GLUCOSE-CAPILLARY: 110 mg/dL — AB (ref 65–99)
GLUCOSE-CAPILLARY: 151 mg/dL — AB (ref 65–99)
Glucose-Capillary: 147 mg/dL — ABNORMAL HIGH (ref 65–99)

## 2016-01-26 LAB — CALCIUM, IONIZED: Calcium, Ionized, Serum: 4.8 mg/dL (ref 4.5–5.6)

## 2016-01-26 MED ORDER — MORPHINE BOLUS VIA INFUSION
2.0000 mg | INTRAVENOUS | Status: DC | PRN
  Administered 2016-01-26: 2 mg via INTRAVENOUS
  Filled 2016-01-26 (×2): qty 2

## 2016-01-26 MED ORDER — MORPHINE SULFATE 25 MG/ML IV SOLN
7.0000 mg/h | INTRAVENOUS | Status: DC
  Administered 2016-01-26: 5 mg/h via INTRAVENOUS
  Administered 2016-01-28: 06:00:00 7 mg/h via INTRAVENOUS
  Filled 2016-01-26 (×2): qty 10

## 2016-01-26 MED ORDER — MIDAZOLAM HCL 2 MG/2ML IJ SOLN: 1.0000 mg | INTRAMUSCULAR | Status: DC | PRN

## 2016-01-26 MED ORDER — GLYCOPYRROLATE 1 MG PO TABS
1.0000 mg | ORAL_TABLET | ORAL | Status: DC | PRN
  Filled 2016-01-26: qty 1

## 2016-01-26 MED ORDER — SODIUM CHLORIDE 0.9 % IV SOLN: 1.0000 mg/h | INTRAVENOUS | Status: DC

## 2016-01-26 MED ORDER — GLYCOPYRROLATE 0.2 MG/ML IJ SOLN
0.2000 mg | INTRAMUSCULAR | Status: DC | PRN
  Administered 2016-01-26: 0.2 mg via INTRAVENOUS
  Filled 2016-01-26: qty 1

## 2016-01-26 MED ORDER — GLYCOPYRROLATE 0.2 MG/ML IJ SOLN: 0.2000 mg | INTRAMUSCULAR | Status: DC | PRN

## 2016-01-26 NOTE — Progress Notes (Addendum)
Chaplain rounded the unit and provided a spiritual and compassionate presence with prayer.  Jefm PettyChaplain Oda Placke [phone]

## 2016-01-26 NOTE — Progress Notes (Signed)
Care withdrawn today, comfort care now.  Pt on morphine drip for comfort. Pt withdraws from pain, but no purposeful movements.  Obtunded.  Placed on 2L [place] for comfort, rhonchi upon auscultation.  Remains NPO.  Family updated of pt condition.

## 2016-01-26 NOTE — Progress Notes (Signed)
PULMONARY / CRITICAL CARE MEDICINE   Name: [name] MRN: [id number] DOB: Aug 21, 1928    ADMISSION DATE:  01/21/2016  PT PROFILE:   27 F NH resident with COPD, dementia and chronic debilitation who was witnessed to aspirate while eating and suffered cardiac arrest requiring 10-15 mins ACLS  MAJOR EVENTS/TEST RESULTS: 04/13 CT head: No acute intracranial pathology seen on CT. Mild to moderate cortical volume loss and scattered small vessel ischemic microangiopathy. Chronic lacunar infarcts at the basal ganglia bilaterally, and at the right thalamus  04/13 CTAP: likely ileus  04/14 TTE: Results pending. 04/14 EEG: Results pending. 04/14 Neurology Consultation: Pending.  INDWELLING DEVICES:: ETT 04/13 >>    MICRO DATA: MRSA PCR 04/13 >> NEG   ANTIMICROBIALS:  Pip-tazo 04/13 >>    SUBJECTIVE/HISTORY OF PRESENT ILLNESS:   Patient cannot provide a review of systems or history of present illness due to unresponsiveness as she is on the ventilator. Patient is off from  any sedation, and remains obtunded at this time.     VITAL SIGNS: BP 128/57 mmHg  Pulse 58  Temp(Src) 98.4 F (36.9 C) (Rectal)  Resp 14  Ht  (1.6 m)  Wt 200 lb 6.4 oz (90.9 kg)  BMI 35.51 kg/m2  SpO2 98%  HEMODYNAMICS:    VENTILATOR SETTINGS: Vent Mode:  [-] PRVC FiO2 (%):  [28 %] 28 % Set Rate:  [14 bmp] 14 bmp Vt Set:  [500 mL] 500 mL PEEP:  [5 cmH20] 5 cmH20  INTAKE / OUTPUT: I/O last 3 completed shifts: In: 250 [IV Piggyback:250] Out: 1150 [Urine:1150]  PHYSICAL EXAMINATION: General: patient remains obtunded with response to only painful stimuli Neuro: obtunded, responds to painful stimuli HEENT:Atraumatic, normocephalic, conjugate pupils, sluggishly reacting  Cardiovascular:S1S2, NO mrg Lungs: diminished at the base., no wheezes, crackles or rhonchi noted Abdomen: obese, soft, +BS Ext: warm, no edema  LABS:  BMET  Recent Labs Lab 01/23/16 0602 01/24/16 0447 01/25/16 0458   NA 132* 134* 135  K 3.7 3.3* 3.7  CL 103 102 104  CO2 BUN 23* 26* 21*  CREATININE 1.19* 1.35* 0.87  GLUCOSE 159* 255* 183*    Electrolytes  Recent Labs Lab 01/23/16 0602 01/24/16 0447 01/25/16 0458  CALCIUM 7.7* 8.2* 8.4*  MG 1.7 1.9  --   PHOS 2.3*  --   --     CBC  Recent Labs Lab 01/23/16 0602 01/24/16 0447 01/25/16 0458  WBC 10.6 11.2* 10.0  HGB 10.4* 10.6* 10.6*  HCT 30.9* 31.3* 30.3*  PLT 162 157 173    Coag's No results for input(s): APTT, INR in the last 168 hours.  Sepsis Markers  Recent Labs Lab 01/21/16 2109 01/21/16 2306  LATICACIDVEN 5.9* 4.7*    ABG  Recent Labs Lab 01/23/16 0433 01/24/16 0520 01/25/16 0505  PHART 7.44 7.41 7.46*  PCO2ART 36 36 38  PO2ART 78* 92 90    Liver Enzymes  Recent Labs Lab 01/21/16 1859 01/23/16 0602  AST 41 63*  ALT 18 27  ALKPHOS 64 44  BILITOT 0.3 0.7  ALBUMIN 3.4* 3.2*    Cardiac Enzymes  Recent Labs Lab 01/21/16 2306 01/22/16 0522 01/22/16 1056  TROPONINI 0.28* 0.80* 0.79*    Glucose  Recent Labs Lab 01/25/16 1157 01/25/16 1637 01/25/16 1945 01/25/16 2356 01/26/16 0414 01/26/16 0741  GLUCAP 165* 191* 186* 146* 123* 110*    CXR: cardiomegaly, diffuse interstitial infiltrates c/w edema    ASSESSMENT / PLAN:  PULMONARY A: VDRF post  cardiorespiratory arrest due to aspirated meat P:   Vent settings established Vent bundle implemented Daily SBT as indicated Continue steroids and bronchodilators fo   CARDIOVASCULAR A:  S/P cardiac arrest due to aspiration Cardiomegaly on CXR Minimally elevated trop I - likely demand ischemia around time of arrest Aspiration of food, we are continuing to suction food from her lung. P:  Hemodynamic monitoring Echocardiogram The patient may undergo a terminal wean in the next 24-48 hours, therefore we'll hold off on bronchoscopy unless aggressive care is desired. It is unlikely that bronchoscopy with airway clearance  will meaningfully improve her outcome , given her poor very poor prognosis and unresponsive state at this time.  RENAL A:   Acute kidney injury  P:  Monitor BMET intermittently Monitor I/Os Correct electrolytes as indicated Nephrology following the patient.   GASTROINTESTINAL A:   Obesity Possible ileus (by CTAP - unclear why this test was performed) P:   SUP: enteral PPI Begin TFs AM 04/14  HEMATOLOGIC A:   No issues P:  Continue : SQ heparin Monitor CBC intermittently Transfuse per usual guidelines  INFECTIOUS A:   Aspiration - possible PNA P:    Continue to Monitor temp, WBC count  continue Zosyn for now  ENDOCRINE A:   DM 2 with hyperglycemia P:   Continue SSI  NEUROLOGIC A:   Post arrest anoxic encephalopathy Prognosis guarded Onsedation, the patient continues to be unresponsive. P:   RASS goal: 0, -1 EEG 04/14 indicative of anoxic brain injury  family is  aware of the poor prognosis.     Peyton Spengler,AG-ACNP Pulmonary & Critical Care

## 2016-01-26 NOTE — Progress Notes (Signed)
Nutrition Brief Note   Chart reviewed. Pt now transitioning to comfort care.  No active diet order currently. No further nutrition interventions warranted at this time.  Please re-consult RD if change in nutritional poc.  Leda QuailAllyson Daelin Haste, RD, LDN Pager [phone] Weekend/On-Call Pager [phone]

## 2016-01-26 NOTE — Progress Notes (Signed)
Subjective: Patient remains unresponsive.    Objective: Current vital signs: BP 134/86 mmHg  Pulse 54  Temp(Src) 98.4 F (36.9 C) (Rectal)  Resp 14  Ht _0  (1.6 m)  Wt 90.9 kg (200 lb 6.4 oz)  BMI 35.51 kg/m2  SpO2 99% Vital signs in last 24 hours: Temp:  [97.5 F (36.4 C)-98.6 F (37 C)] 98.4 F (36.9 C) (04/18 1000) Pulse Rate:  [54-84] 54 (04/18 1000) Resp:  [14-19] 14 (04/18 1000) BP: (125-155)/(54-86) 134/86 mmHg (04/18 1000) SpO2:  [96 %-99 %] 99 % (04/18 1000) FiO2 (%):  [28 %] 28 % (04/18 0806) Weight:  [90.9 kg (200 lb 6.4 oz)] 90.9 kg (200 lb 6.4 oz) (04/18 0516)  Intake/Output from previous day: 04/17 0701 - 04/18 0700 In: 150 [IV Piggyback:150] Out: 725 [Urine:725] Intake/Output this shift:   Nutritional status:    Neurologic Exam: Mental Status: Patient does not respond to verbal stimuli or deep sternal rub. Does not follow commands. No verbalizations noted.  Cranial Nerves: II: patient does not respond confrontation bilaterally, pupils right 2 mm, left 2 mm,and unreactive bilaterally III,IV,VI: doll's response present bilaterally.  V,VII: corneal reflex reduced bilaterally  VIII: patient does not respond to verbal stimuli IX,X: gag reflex reduced, XI: trapezius strength unable to test bilaterally XII: tongue strength unable to test Motor: Increased tone in the RUE and RLE.  No spontaneous or purposeful movements noted.   Sensory: Does not respond to noxious stimuli in the upper extremities or left lower extremity. In the right lower extremity a triple reflex response is noted Deep Tendon Reflexes:  3+ on the right and 1+ on the left Plantars: upgoing bilaterally Cerebellar: Unable to perform  Lab Results: Basic Metabolic Panel:  Recent Labs Lab 01/21/16 1859 01/21/16 2306 01/22/16 0522 01/23/16 0602 01/24/16 0447 01/25/16 0458  NA 137  --  138 132* 134* 135  K 3.9  --  4.3 3.7 3.3* 3.7  CL 106  --  110 103 102 104  CO2 17*  --   _1 GLUCOSE 305*  --  232* 159* 255* 183*  BUN 25*  --  25* 23* 26* 21*  CREATININE 1.30* 1.07* 0.96 1.19* 1.35* 0.87  CALCIUM 8.4*  --  7.6* 7.7* 8.2* 8.4*  MG  --   --   --  1.7 1.9  --   PHOS  --   --   --  2.3*  --   --     Liver Function Tests:  Recent Labs Lab 01/21/16 1859 01/23/16 0602  AST 41 63*  ALT 18 27  ALKPHOS 64 44  BILITOT 0.3 0.7  PROT 7.2 6.7  ALBUMIN 3.4* 3.2*   No results for input(s): LIPASE, AMYLASE in the last 168 hours. No results for input(s): AMMONIA in the last 168 hours.  CBC:  Recent Labs Lab 01/21/16 1859 01/21/16 2306 01/22/16 0522 01/23/16 0602 01/24/16 0447 01/25/16 0458  WBC 7.9 11.5* 11.5* 10.6 11.2* 10.0  NEUTROABS 2.6  --   --   --   --   --   HGB 11.5* 11.4* 11.0* 10.4* 10.6* 10.6*  HCT 36.0 34.6* 32.8* 30.9* 31.3* 30.3*  MCV 100.2* 94.8 94.9 92.8 95.0 92.2  PLT 196 202 186 162 157 173    Cardiac Enzymes:  Recent Labs Lab 01/21/16 1859 01/21/16 2306 01/22/16 0522 01/22/16 1056  TROPONINI 0.03 0.28* 0.80* 0.79*    Lipid Panel: No results for input(s): CHOL, TRIG, HDL, CHOLHDL, VLDL, LDLCALC  in the last 168 hours.  CBG:  Recent Labs Lab 01/25/16 1637 01/25/16 1945 01/25/16 2356 01/26/16 0414 01/26/16 0741  GLUCAP 191* 186* 146* 123* 110*    Microbiology: Results for orders placed or performed during the hospital encounter of 01/21/16  MRSA PCR Screening     Status: None   Collection Time: 01/21/16 10:39 PM  Result Value Ref Range Status   MRSA by PCR NEGATIVE NEGATIVE Final    Comment:        The GeneXpert MRSA Assay (FDA approved for NASAL specimens only), is one component of a comprehensive MRSA colonization surveillance program. It is not intended to diagnose MRSA infection nor to guide or monitor treatment for MRSA infections.   Culture, expectorated sputum-assessment     Status: None   Collection Time: 01/23/16 11:33 AM  Result Value Ref Range Status   Specimen Description  EXPECTORATED SPUTUM  Final   Special Requests NONE  Final   Sputum evaluation THIS SPECIMEN IS ACCEPTABLE FOR SPUTUM CULTURE  Final   Report Status 01/23/2016 FINAL  Final  Culture, respiratory (NON-Expectorated)     Status: None (Preliminary result)   Collection Time: 01/23/16 11:33 AM  Result Value Ref Range Status   Specimen Description EXPECTORATED SPUTUM  Final   Special Requests NONE Reflexed from [id number]  Final   Gram Stain   Final    FEW WBC SEEN MANY GRAM POSITIVE RODS RARE GRAM POSITIVE COCCI    Culture   Final    LIGHT GROWTH STAPHYLOCOCCUS AUREUS SUSCEPTIBILITIES TO FOLLOW    Report Status PENDING  Incomplete  CULTURE, BLOOD (ROUTINE X 2) w Reflex to PCR ID Panel     Status: None (Preliminary result)   Collection Time: 01/24/16  4:46 AM  Result Value Ref Range Status   Specimen Description BLOOD LEFT ARM  Final   Special Requests   Final    BOTTLES DRAWN AEROBIC AND ANAEROBIC 10CCAERO,10CCANA   Culture NO GROWTH 2 DAYS  Final   Report Status PENDING  Incomplete  CULTURE, BLOOD (ROUTINE X 2) w Reflex to PCR ID Panel     Status: None (Preliminary result)   Collection Time: 01/24/16  4:47 AM  Result Value Ref Range Status   Specimen Description BLOOD RIGHT HAND  Final   Special Requests BOTTLES DRAWN AEROBIC AND ANAEROBIC Jensen  Final   Culture NO GROWTH 2 DAYS  Final   Report Status PENDING  Incomplete    Coagulation Studies: No results for input(s): LABPROT, INR in the last 72 hours.  Imaging: Dg Chest 1 View  01/25/2016  CLINICAL DATA:  Dyspnea, cardio respiratory arrest, history of COPD, diabetes, chronic renal disease, and dementia. Current smoker. EXAM: CHEST 1 VIEW COMPARISON:  Portable chest x-ray of January 24, 2016 FINDINGS: The lungs are mildly hypoinflated. The interstitial markings remain coarse but have improved slightly. There is no significant pleural effusion. The heart is not enlarged. The central pulmonary vascularity is mildly prominent and  indistinct. The endotracheal tube tip lies 3.4 cm above the carina. The esophagogastric tube tip projects below the inferior margin of the image. IMPRESSION: Mild interval improvement in pulmonary interstitial edema. Persistent mild hypoinflation without evidence of pneumonia. Electronically Signed   By: David  Martinique M.D.   On: 01/25/2016 07:45   Dg Abd 1 View  01/24/2016  CLINICAL DATA:  Ileus on previous abdominal imaging. Last bowel movement on 04/14. Abdominal distention. Patient is on a ventilator with OG tube present. EXAM: ABDOMEN - 1 VIEW  COMPARISON:  CT 01/21/2016 FINDINGS: Diffuse gaseous distention of the colon with scattered gas in nondistended small bowel. Changes most likely to represent adynamic ileus. Scattered stool is also present in the colon and rectum. An enteric tube is present in the upper abdomen consistent with location in the distal stomach. No radiopaque stones. Degenerative changes in the lumbar spine and hips. Vascular calcifications. IMPRESSION: Gaseous distention of colon most likely due to adynamic ileus. Scattered stool in the colon and rectum. Electronically Signed   By: Lucienne Capers M.D.   On: 01/24/2016 22:40   Ct Head Wo Contrast  01/24/2016  CLINICAL DATA:  Patient with dementia. Aspiration and cardiac arrest. EXAM: CT HEAD WITHOUT CONTRAST TECHNIQUE: Contiguous axial images were obtained from the base of the skull through the vertex without intravenous contrast. COMPARISON:  Brain CT 01/21/2016 FINDINGS: Interval loss of gray-white differentiation and hypodensity within the caudate heads and basal ganglia bilaterally. Additionally throughout the peripheral cortex including the frontal, parietal and temporal lobes there is suggestion of loss of gray-white differentiation. Orbits are unremarkable. Paranasal sinuses are well aerated. Mastoid air cells are unremarkable. Calvarium is intact. IMPRESSION: Interval loss of gray-white differentiation and development of edema  within the caudate head, bilateral basal ganglia and throughout the cerebral cortex concerning for anoxic brain injury. Electronically Signed   By: Lovey Newcomer M.D.   On: 01/24/2016 15:23    Medications:  I have reviewed the patient's current medications. Scheduled: . amLODipine  5 mg Per Tube Daily  . antiseptic oral rinse  7 mL Mouth Rinse 10 times per day  . chlorhexidine gluconate (SAGE KIT)  15 mL Mouth Rinse BID  . heparin  5,000 Units Subcutaneous 3 times per day  . insulin aspart  0-15 Units Subcutaneous 6 times per day  . pantoprazole sodium  40 mg Per Tube Q1200  . piperacillin-tazobactam (ZOSYN)  IV  3.375 g Intravenous 3 times per day    Assessment/Plan: Patient remains unresponsive off sedation and today actually has less response than yesterday which may indicate progressive cerebral edema.  Does not fulfill criteria for brain death with some brain stem responses still present but hemispheric function is severely compromised as evidenced by repeat EEG that continues to show no cerebral activity.  Prognosis is extremely poor for the occurrence of a functional recovery.  Recommendation is for the patient to have all care removed and this is scheduled for later today.  Prognosis has been discussed with the family and critical care physician.  Alexis Goodell, MD Neurology [phone] 01/26/2016  10:24 AM   LOS: 5 days   Alexis Goodell, MD Neurology [phone] 01/26/2016  10:16 AM

## 2016-01-26 NOTE — Progress Notes (Signed)
Patient and order verified.  Extubated patient per MD order to 2lpm nasal canula.  Celene SkeenJeramiah Keene RN present at bedside.

## 2016-01-26 NOTE — Progress Notes (Signed)
Pt extubated to comfort care per Dr. Dema SeverinMungal order and family request.  Pt placed on morphine drip for comfort.  Pt hemodynamically stable at time of extubation, resting comfortably with morphine drip.

## 2016-01-26 NOTE — Progress Notes (Signed)
Pt daughters and sisters at bedside, per family they are ready to withdraw care and make pt comfort care.  Notified Dr. Dema SeverinMungal of family wishes.  Per Dr. Dema SeverinMungal he will place orders for withdrawal of care and comfort care orders.

## 2016-01-27 DIAGNOSIS — I10 Essential (primary) hypertension: Secondary | ICD-10-CM

## 2016-01-27 DIAGNOSIS — R400 Somnolence: Secondary | ICD-10-CM

## 2016-01-27 DIAGNOSIS — Z515 Encounter for palliative care: Secondary | ICD-10-CM

## 2016-01-27 DIAGNOSIS — F0390 Unspecified dementia without behavioral disturbance: Secondary | ICD-10-CM

## 2016-01-27 DIAGNOSIS — Z79899 Other long term (current) drug therapy: Secondary | ICD-10-CM

## 2016-01-27 DIAGNOSIS — N289 Disorder of kidney and ureter, unspecified: Secondary | ICD-10-CM

## 2016-01-27 DIAGNOSIS — J449 Chronic obstructive pulmonary disease, unspecified: Secondary | ICD-10-CM

## 2016-01-27 DIAGNOSIS — Z66 Do not resuscitate: Secondary | ICD-10-CM

## 2016-01-27 DIAGNOSIS — E119 Type 2 diabetes mellitus without complications: Secondary | ICD-10-CM

## 2016-01-27 DIAGNOSIS — F17210 Nicotine dependence, cigarettes, uncomplicated: Secondary | ICD-10-CM

## 2016-01-27 DIAGNOSIS — R0681 Apnea, not elsewhere classified: Secondary | ICD-10-CM

## 2016-01-27 LAB — CULTURE, RESPIRATORY W GRAM STAIN

## 2016-01-27 LAB — CULTURE, RESPIRATORY

## 2016-01-27 MED ORDER — PROCHLORPERAZINE 25 MG RE SUPP
25.0000 mg | Freq: Three times a day (TID) | RECTAL | Status: DC | PRN
  Filled 2016-01-27: qty 1

## 2016-01-27 MED ORDER — LORAZEPAM 2 MG/ML IJ SOLN
1.0000 mg | INTRAMUSCULAR | Status: DC | PRN
Start: 2016-01-27 — End: 2016-01-28

## 2016-01-27 MED ORDER — BISACODYL 10 MG RE SUPP: 10.0000 mg | Freq: Every day | RECTAL | Status: DC | PRN

## 2016-01-27 NOTE — Plan of Care (Signed)
Problem: Skin Integrity: Goal: Risk for impaired skin integrity will decrease Outcome: Progressing Wound care, daily dressing changes to pressure ulcer left heel.   Problem: Respiratory: Goal: Verbalizations of increased ease of respirations will increase Outcome: Not Applicable Date Met:  32/91/91 Pt unresponsive

## 2016-01-27 NOTE — Progress Notes (Signed)
Update: Patient transferred out the ICU, not requiring ICU services. DNR, comfort care measures ordered. Spoke with Dr. Winona LegatoVaickute, who has accepted patient to the hospitalist service today (01/27/16 @7 .40a).   Thank you for consulting [facility], we will signoff at this time.  Please feel free to contact us with any questions at [phone] (please enter 7-digits).  Stephanie AcreVishal Malekai Markwood, MD [facility] Pager [phone]- [phone] (please enter 7-digits) On Call Pager - [phone] (please enter 7-digits)

## 2016-01-27 NOTE — Progress Notes (Signed)
Palliative Care Update  I see there is a THIRD consult for Palliative Care ([facility] and family is planning on having a large gathering this afternoon so that I can explain to them what all has gone on and what will likely happen.  I have reviewed chart again and will be more than happy to meet with all the family members that show up.  Pt's VS are pretty stable on 5 mg/hr of morphine drip.  She has brain edema and anoxic damage and hope for recovery does not exist.     See full note to follow after I meet with the family around 2:30-3 pm.  Sue HalterMargaret F Berthel Bagnall, MD

## 2016-01-27 NOTE — Progress Notes (Signed)
[place] [facility] [place] NAME: [name]    MR#:  [id number]  DATE OF BIRTH:  1928/04/11  SUBJECTIVE:  CHIEF COMPLAINT:   Chief Complaint  Patient presents with  . Post Arrest   unresponsive  REVIEW OF SYSTEMS:  Review of Systems  Unable to perform ROS: critical illness   DRUG ALLERGIES:  No Known Allergies VITALS:  Blood pressure 132/87, pulse 62, temperature 97.7 F (36.5 C), temperature source Oral, resp. rate 16, height 5' 3"  (1.6 m), weight 90.9 kg (200 lb 6.4 oz), SpO2 92 %. PHYSICAL EXAMINATION:  Physical Exam  Constitutional: She is well-developed, well-nourished, and in no distress. Vital signs are normal. She has a sickly appearance.  HENT:  Head: Normocephalic and atraumatic.  Eyes: Conjunctivae and EOM are normal. Pupils are equal, round, and reactive to light.  Neck: Normal range of motion. Neck supple. No tracheal deviation present. No thyromegaly present.  Cardiovascular: Normal rate, regular rhythm and normal heart sounds.   Pulmonary/Chest: Effort normal and breath sounds normal. No respiratory distress. She has no wheezes. She exhibits no tenderness.  Abdominal: Soft. Bowel sounds are normal. She exhibits no distension. There is no tenderness.  Musculoskeletal: Normal range of motion.  Neurological: No cranial nerve deficit.  Unresponsive Patient does not respond to verbal stimuli or deep sternal rub. Does not follow commands. No verbalizations noted.  Cranial Nerves:  Skin: Skin is warm and dry. No rash noted.  Psychiatric:  Unable to evaluate - unresponsive   LABORATORY PANEL:   CBC  Recent Labs Lab 01/25/16 0458  WBC 10.0  HGB 10.6*  HCT 30.3*  PLT 173   ------------------------------------------------------------------------------------------------------------------ Chemistries   Recent Labs Lab 01/23/16 0602 01/24/16 0447 01/25/16 0458  NA 132* 134* 135  K 3.7 3.3* 3.7  CL 103  102 104  CO2 23 23 24   GLUCOSE 159* 255* 183*  BUN 23* 26* 21*  CREATININE 1.19* 1.35* 0.87  CALCIUM 7.7* 8.2* 8.4*  MG 1.7 1.9  --   AST 63*  --   --   ALT 27  --   --   ALKPHOS 44  --   --   BILITOT 0.7  --   --    RADIOLOGY:  No results found. ASSESSMENT AND PLAN:  [name] is a 82 y.o. female with a known history of of COPD, dementia, diabetes, CKD-III, GERD who is a resident at [facility] admitted after cardiac arrest.   * VDRF - terminal extubated on 4/18 as family chose comfort care  * Aspiration event  * cardiac arrest  * Anoxic brain Injury - Comfort care only  * Lactic acidosis  -Supportive care  * Type 2 diabetes  * Chronic kidney disease stage III   Very poor prognosis - Palliative care met with family this afternoon. And are in agreement - no artificial feedings or fluids, ongoing morphine drip, and other symptom management as pts comfort needs dictate. They understand that pt seems close to passing away due to the apneic episodes. We do not anticipate needing to transfer pt to [place] [facility] or back to the SNF at this time.   All the records are reviewed and case discussed with Care Management/Social Worker. Management plans discussed with the patient, dr Megan Salon family and they are in agreement.  CODE STATUS: DNR  TOTAL TIME TAKING CARE OF THIS PATIENT: 15 minutes.   More than 50% of the time was spent in counseling/coordination of  care: YES  Actively Dying. All family at bedside.   [facility], Erby Sanderson M.D on 01/27/2016 at 4:36 PM  Between 7am to 6pm - Pager - [phone]  After 6pm go to [email or web address] - password [email or web address] [facility]  [facility]  Office  [phone]  CC: Primary care physician; No PCP Per Patient  Note: This dictation was prepared with Dragon dictation along with smaller phrase technology. Any transcriptional errors that result from this process are unintentional.

## 2016-01-27 NOTE — Plan of Care (Signed)
Notified daughter Oretha MilchLinda Sellars on update on patient condition and that she was moved from critical care last night and moved to room 123. Also, I let her know that we will provide patient with a larger room to accommodate family ( pt has large family and comfort care).

## 2016-01-27 NOTE — Consult Note (Signed)
Palliative Medicine Inpatient Consult Note   Name: [name] Date: 01/27/2016 MRN: [id number]  DOB: Feb 09, 1928  Referring Physician: Max Sane, MD  Palliative Care consult requested for this 82 y.o. female for goals of medical therapy in patient with anoxic brain injury.  TODAY'S DISCUSSION AND DECISIONS: Daughter, Vaughan Basta, is not HCPOA. There is NO HCPOA. Vaughan Basta had been the 'Responsible Party' for pt at the facility, but there are no legally designated decision makers for pt.  That said, the pt has three adult daughters and all were in attendance at the meeting today. All were in agreement with plan--though they did have questions.   Pt was noted to have apneic spells lasting as long as 10 seconds while I was in the room.  Pt is DNR and DNI.  I met with about 15 close family members in the room, bedside of pt, for about an hour.  They had some questions related to what went on with pt and I reviewed her hospital course with them and answered their questions. They asked about why we were not giving pt food and water. We discussed the various issues surrounding people dying 'while not eating'.  I explained that the pt is dying and not eating, but she is not dying of starvation but rather not eating or drinking while she gradually dies of her brain injury.  The family members stated that they have a strong faith and much of what we discussed was framed with faith surrounding the medical matters.  We discussed miracles and science as it pertains to this situation.    Ultimately, family was in agreement with current plan of care which includes no artificial feedings or fluids, ongoing morphine drip, and other symptom management as pts comfort needs dictate.  They understand that pt seems close to passing away due to the apneic episodes.  We do not anticipate needing to transfer pt to [place] or back to the SNF,since she seems to be quite close.  Family prefers this at this time.  I have  communicated this with pt's attending, Dr Manuella Ghazi, and with pts nurse.  Morphine drip at 7 mg/ hr continues.    I will see pt daily or more often until she passes on.  This was a wonderful family to meet with --I think I got as much support from them as I was able to give to them.    ------------------------------------------------------------------------ NARRATIVE HISTORY: Pt is normally a resident of a SNF and she was having intermittent swallowing problems according to family. Pt choked on a piece of meat and underwent CPR with asystole lasting about 15 minutes.  She was intubated and supported with critical care measures for several days so that it could be determined whether or not she would be able to recover.  She had a neurology consult and EEGs. Neurology noted that imaging was starting to show anoxic brain injury and prognosis was extremely grim for any kind of meaningful recovery. Family at first wanted pt to be full code but then changed this to DNR. They also had hope for recovery despite lack of medical liklihood of this happening.  Daughter, Vaughan Basta, had been involved at the SNF, but all adult children and other family members were involved in the eventual decision to extubate pt.  She was started on a morphine drip, but her respiratory function had actually stabilized or even improved and she was noted to still be breathing on her own.  She was transferred to the oncology unit  for comfort care and a morphine drip of 5 mg / hr was continued.    --------------------------------------------------------------- REVIEW OF SYSTEMS:  Patient is not able to provide ROS due to anoxic brain injury  SPIRITUAL SUPPORT SYSTEM: Yes --large and loving family.  SOCIAL HISTORY:  reports that she has been smoking.  She does not have any smokeless tobacco history on file. She reports that she does not drink alcohol.  LEGAL DOCUMENTS:  I have created and placed a portable DNR form into the paper chart.    CODE STATUS: DNR  PAST MEDICAL HISTORY: Past Medical History  Diagnosis Date  . Dementia   . Hypertension   . Renal disorder   . Diabetes mellitus without complication ([place])   . COPD (chronic obstructive pulmonary disease) ([place])   . GERD (gastroesophageal reflux disease)     PAST SURGICAL HISTORY: History reviewed. No pertinent past surgical history.  ALLERGIES:  has No Known Allergies.  MEDICATIONS:  Current Facility-Administered Medications  Medication Dose Route Frequency Provider Last Rate Last Dose  . bisacodyl (DULCOLAX) suppository 10 mg  10 mg Rectal Daily PRN Colleen Can, MD      . chlorhexidine gluconate (SAGE KIT) (PERIDEX) 0.12 % solution 15 mL  15 mL Mouth Rinse BID Fritzi Mandes, MD   15 mL at 01/26/16 2053  . glycopyrrolate (ROBINUL) injection 0.2 mg  0.2 mg Intravenous Q4H PRN Bincy S Varughese, NP   0.2 mg at 01/26/16 1344  . LORazepam (ATIVAN) injection 1 mg  1 mg Intravenous Q4H PRN Colleen Can, MD      . morphine 250 mg in dextrose 5 % 250 mL (1 mg/mL) infusion  7 mg/hr Intravenous Continuous Colleen Can, MD 7 mL/hr at 01/27/16 1330 7 mg/hr at 01/27/16 1330  . morphine bolus via infusion 2 mg  2 mg Intravenous Q15 min PRN Holley Raring, NP   2 mg at 01/26/16 1357  . prochlorperazine (COMPAZINE) suppository 25 mg  25 mg Rectal Q8H PRN Colleen Can, MD        Vital Signs: BP 132/87 mmHg  Pulse 62  Temp(Src) 97.7 F (36.5 C) (Oral)  Resp 16  Ht _0  (1.6 m)  Wt 90.9 kg (200 lb 6.4 oz)  BMI 35.51 kg/m2  SpO2 92% Filed Weights   01/24/16 0543 01/25/16 0500 01/26/16 0516  Weight: 90.5 kg (199 lb 8.3 oz) 92 kg (202 lb 13.2 oz) 90.9 kg (200 lb 6.4 oz)    Estimated body mass index is 35.51 kg/(m^2) as calculated from the following:   Height as of this encounter: _1  (1.6 m).   Weight as of this encounter: 90.9 kg (200 lb 6.4 oz).  PERFORMANCE STATUS (ECOG) : 4 - Bedbound  PHYSICAL EXAM: Lying in medical bed, blowing  past lips with vocalization-- with each expiration Eyes closed NAD noted.  No grimacing, but vocalization is noted. No JVD or TM Heart rrr no m Lungs sound cta anteriorly Abd soft BS noted Ext no mottling or cyanosis as yet   LABS: CBC:    Component Value Date/Time   WBC 10.0 01/25/2016 0458   WBC 9.4 03/05/2012 0427   HGB 10.6* 01/25/2016 0458   HGB 11.8* 03/05/2012 0427   HCT 30.3* 01/25/2016 0458   HCT 35.4 03/05/2012 0427   PLT 173 01/25/2016 0458   PLT 256 03/05/2012 0427   MCV 92.2 01/25/2016 0458   MCV 97 03/05/2012 0427   NEUTROABS 2.6 01/21/2016 1859   NEUTROABS  5.2 03/05/2012 0427   LYMPHSABS 4.9* 01/21/2016 1859   LYMPHSABS 3.2 03/05/2012 0427   MONOABS 0.2 01/21/2016 1859   MONOABS 0.7 03/05/2012 0427   EOSABS 0.2 01/21/2016 1859   EOSABS 0.2 03/05/2012 0427   BASOSABS 0.0 01/21/2016 1859   BASOSABS 0.0 03/05/2012 0427   Comprehensive Metabolic Panel:    Component Value Date/Time   NA 135 01/25/2016 0458   NA 139 06/30/2014 1031   K 3.7 01/25/2016 0458   K 4.7 06/30/2014 1031   CL 104 01/25/2016 0458   CL 105 06/30/2014 1031   CO2 24 01/25/2016 0458   CO2 28 06/30/2014 1031   BUN 21* 01/25/2016 0458   BUN 20* 06/30/2014 1031   CREATININE 0.87 01/25/2016 0458   CREATININE 1.45* 06/30/2014 1031   GLUCOSE 183* 01/25/2016 0458   GLUCOSE 144* 06/30/2014 1418   CALCIUM 8.4* 01/25/2016 0458   CALCIUM 8.9 06/30/2014 1031   AST 63* 01/23/2016 0602   AST 23 03/01/2012 0030   ALT 27 01/23/2016 0602   ALT 17 03/01/2012 0030   ALKPHOS 44 01/23/2016 0602   ALKPHOS 60 03/01/2012 0030   BILITOT 0.7 01/23/2016 0602   BILITOT 0.2 03/01/2012 0030   PROT 6.7 01/23/2016 0602   PROT 7.0 03/01/2012 0030   ALBUMIN 3.2* 01/23/2016 0602   ALBUMIN 3.0* 03/01/2012 0030     More than 50% of the visit was spent in counseling/coordination of care: Yes  Time Spent: 80 minutes

## 2016-01-27 NOTE — Care Management Important Message (Signed)
Important Message  Patient Details  Name: Latina CraverSelena Escareno MRN: [id number] Date of Birth: 1927-12-25   Medicare Important Message Given:  Yes    Olegario MessierKathy A Arlene Genova 01/27/2016, 10:40 AM

## 2016-01-28 MED ORDER — MORPHINE SULFATE 25 MG/ML IV SOLN: 7.0000 mg/h | INTRAVENOUS | Status: DC

## 2016-01-28 MED ORDER — DEXTROSE 5 % IV SOLN: 7.0000 mg/h | INTRAVENOUS | Status: AC

## 2016-01-28 MED ORDER — BISACODYL 10 MG RE SUPP: 10.0000 mg | Freq: Every day | RECTAL | Status: AC | PRN

## 2016-01-28 MED ORDER — PROCHLORPERAZINE 25 MG RE SUPP
25.0000 mg | Freq: Three times a day (TID) | RECTAL | Status: AC | PRN
Start: 2016-01-28 — End: ?

## 2016-01-28 NOTE — Progress Notes (Signed)
Pt transferred to hospice home per orders via EMS family present, IV's left in place per Hospice request

## 2016-01-28 NOTE — Progress Notes (Addendum)
Palliative Medicine Inpatient Consult Follow Up Note   Name: [name] Date: 01/28/2016 MRN: [id number]  DOB: 02/20/1928  Referring Physician: Katharina Caper, MD  Palliative Care consult requested for this 82 y.o. female for goals of medical therapy in patient with post arrest anoxic brain injury.  TODAY'S DISCUSSIONS AND PLANS: Yesterday, pt had apneic episodes and these were happening quite often. It was thought that she was too unstable for transport due to this.  I had mentioned various choices available to family including home, return to her prior facility, their choice of hospice agencies, and also hospice home ---given that pt was noted to be actively dying.  Choice of family was to allow pt to pass away here peacefully --especially since it seemed that she was showing signs of dying soon given the fairly frequent apneic episodes.    Today, however, pt is no longer having any apnea per nursing. She does have a low resp rate of 10 breaths per minute.    I have spoken with care team and nursing. I will talk with pt's daughters who are co-decision makers. They told me yesterday that they decide things together and they also make decisions by consulting many others in the family (most who were present yesterday).   I have called and left a message for the primary contact, Bonita Quin (daughter but NOT POA and NOT HCPOA --no one has these documents).  I could not reach her.  I was able to reach daughter, Talbert Forest.  I presented options of pt leaving hospital for terminal comfort care.  She asked appropriate questions and said she would try to reach her two sisters and then get back with [place] by the end of the day.  I asked her to try to get back to [place] by 2pm. She will try.    Will need to reassess pt for stability to transfer, as her BP and her resp rate are somewhat low and if these drop lower, then she might need to stay here due to instability for transport.    I have paged attending and will now  call daughters.  No family has come in this am so far.  I have updated Child psychotherapist.        REVIEW OF SYSTEMS:  Patient is not able to provide ROS due to anoxic brain injury  CODE STATUS: DNR   PAST MEDICAL HISTORY: Past Medical History  Diagnosis Date  . Dementia   . Hypertension   . Renal disorder   . Diabetes mellitus without complication (HCC)   . COPD (chronic obstructive pulmonary disease) (HCC)   . GERD (gastroesophageal reflux disease)     PAST SURGICAL HISTORY: History reviewed. No pertinent past surgical history.  Vital Signs: BP 83/42 mmHg  Pulse 69  Temp(Src) 97.4 F (36.3 C) (Oral)  Resp 10  Ht  (1.6 m)  Wt 90.9 kg (200 lb 6.4 oz)  BMI 35.51 kg/m2  SpO2 93% Filed Weights   01/24/16 0543 01/25/16 0500 01/26/16 0516  Weight: 90.5 kg (199 lb 8.3 oz) 92 kg (202 lb 13.2 oz) 90.9 kg (200 lb 6.4 oz)    Estimated body mass index is 35.51 kg/(m^2) as calculated from the following:   Height as of this encounter:  (1.6 m).   Weight as of this encounter: 90.9 kg (200 lb 6.4 oz).   PHYSICAL EXAM: Lying in medical bed Unresponsive Eyes closed hrt rrr no m Lungs decreased BS bases Abd soft w/ BS Ext no  mottling visible  VS show BP 83/42 and pulse of 69 with resp 10 and sats 93% on 2 LPM  LABS: CBC:    Component Value Date/Time   WBC 10.0 01/25/2016 0458   WBC 9.4 03/05/2012 0427   HGB 10.6* 01/25/2016 0458   HGB 11.8* 03/05/2012 0427   HCT 30.3* 01/25/2016 0458   HCT 35.4 03/05/2012 0427   PLT 173 01/25/2016 0458   PLT 256 03/05/2012 0427   MCV 92.2 01/25/2016 0458   MCV 97 03/05/2012 0427   NEUTROABS 2.6 01/21/2016 1859   NEUTROABS 5.2 03/05/2012 0427   LYMPHSABS 4.9* 01/21/2016 1859   LYMPHSABS 3.2 03/05/2012 0427   MONOABS 0.2 01/21/2016 1859   MONOABS 0.7 03/05/2012 0427   EOSABS 0.2 01/21/2016 1859   EOSABS 0.2 03/05/2012 0427   BASOSABS 0.0 01/21/2016 1859   BASOSABS 0.0 03/05/2012 0427   Comprehensive Metabolic Panel:     Component Value Date/Time   NA 135 01/25/2016 0458   NA 139 06/30/2014 1031   K 3.7 01/25/2016 0458   K 4.7 06/30/2014 1031   CL 104 01/25/2016 0458   CL 105 06/30/2014 1031   CO2 24 01/25/2016 0458   CO2 28 06/30/2014 1031   BUN 21* 01/25/2016 0458   BUN 20* 06/30/2014 1031   CREATININE 0.87 01/25/2016 0458   CREATININE 1.45* 06/30/2014 1031   GLUCOSE 183* 01/25/2016 0458   GLUCOSE 144* 06/30/2014 1418   CALCIUM 8.4* 01/25/2016 0458   CALCIUM 8.9 06/30/2014 1031   AST 63* 01/23/2016 0602   AST 23 03/01/2012 0030   ALT 27 01/23/2016 0602   ALT 17 03/01/2012 0030   ALKPHOS 44 01/23/2016 0602   ALKPHOS 60 03/01/2012 0030   BILITOT 0.7 01/23/2016 0602   BILITOT 0.2 03/01/2012 0030   PROT 6.7 01/23/2016 0602   PROT 7.0 03/01/2012 0030   ALBUMIN 3.2* 01/23/2016 0602   ALBUMIN 3.0* 03/01/2012 0030     More than 50% of the visit was spent in counseling/coordination of care: YES  Time Spent:  65 min

## 2016-01-28 NOTE — Progress Notes (Signed)
            To Whom It May Concern:       Mrs. [name] daily attended her mother, Mrs. [name] who was hospitalized with life-threatening disease at [facility] from 01/21/2016 through 01/28/2016. The Sue CraverSelena Gates is being discharged to hospice home today, on 01/28/2016. Please excuse [name] from work during these difficult for her days. Thank you for your understanding.       Sincerely,   Katharina Caperima Shiryl Ruddy, MD

## 2016-01-28 NOTE — Plan of Care (Signed)
Problem: Pain Managment: Goal: General experience of comfort will improve Outcome: Progressing Morphine drip at 7 mg/ hr. Did not have to be increased. Pt resting comfortably with periods of apnea   Problem: Fluid Volume: Goal: Ability to maintain a balanced intake and output will improve Outcome: Not Progressing Pt comfort care. Urinary output declining.   Problem: Coping: Goal: Ability to identify and develop effective coping behavior will improve Outcome: Progressing No family present during the night. Abundance of family during the day. Updates provided accordingly

## 2016-01-28 NOTE — Progress Notes (Signed)
Palliative Care Update   Family members arrived at around noon.  I have presented various choices for them since pt now appears stable for transport.    Family requests that pt go to an inpatient hospice facility.  Will pass this on to Child psychotherapistocial Worker  I have completed DC meds which include a morphine drip at 7 mg/hr.   Cammie McgeeMargaret Ferriter Angelita Harnack, MD

## 2016-01-28 NOTE — Progress Notes (Signed)
New hospice home referral received from [facility]. [name] is an 83 year old woman with past medical history significant for history of dementia, hypertension, diabetes, COPD, who came to the [facility] ED after cardiac arrest. Patient was found to be in asystole, she received epinephrine and chest compressions for approximately 15 minutes.  She was intubated and large amount of yellow thick secretions and food material was suctioned from her airway. Post resuscitation patient was noted to have severe neurologic impairment, she was seen by neurologist and it was  felt that she had progressive cerebral edema. The electroencephalogram revealed no cerebral activity.  Palliative Care was consulted and family chose to have patient extubated to comfort care, she was started ona  Continuous morphine drip. Per chart note review she was apneic yesterday 4/19 and felt to unstable to transfer. She has been reevaluated by Palliative Medicine physician Dr. Megan Salon and appears stabel for transport. She remains on the morphine drip at 7 mg/hr. Writer met in the patient's room with her daughter [name], family representative to initiate education regarding hospice services, philosophy and team approach to care with good understanding voiced. Consents signed. Information faxed to referral, report called to the hospice home. Hospital care team all aware of and in agreement with plan for patient to discharge today via EMS to the hospice home. Signed portable DNR in place in discharge packet. Signed prescription for continuous morphine faxed directly to the hospice home, hard copy placed in discharge packet. Thank you for the opportunity to be involved in the care of this patient.  Flo Shanks RN, BSN, [place] [facility] liaison [phone] c

## 2016-01-28 NOTE — Clinical Social Work Note (Addendum)
CSW met with pt's family to address discharge plan after being updated by Palliative Care MD. Pt's family is interested in inpatient Hospice. CSW provided choice, and family chose [facility]. CSW updated Hospital Liaison, who has received referral. A bed at [facility] is available today. CSW paged MD to update pt's disposition. CSW provided support around discharge plan. CSW prepared packet for discharge. CSW is signing off as no further needs identified.   Darden Dates, MSW, LCSW  Clinical Social Worker [phone]  ADDENDUM: CSW will updated [place] as she was admitted from facility.  Darden Dates, MSW, LCSW

## 2016-01-28 NOTE — Progress Notes (Signed)
EMS notified for transport, Staff RN Lambert Modyracie and family made aware.  Dayna BarkerKaren Robertson RN, BSN, [facility] [place], [facility] [phone]

## 2016-01-28 NOTE — Discharge Summary (Signed)
[facility] [place] [facility]   PATIENT NAME: [name]    MR#:  [id number]  DATE OF BIRTH:  May 24, 1928  DATE OF ADMISSION:  01/21/2016 ADMITTING PHYSICIAN: Enedina Finner, MD  DATE OF DISCHARGE: No discharge date for patient encounter.  PRIMARY CARE PHYSICIAN: No PCP Per Patient     ADMISSION DIAGNOSIS:  Cardiac arrest (HCC) [I46.9] Abdominal distension [R14.0] Choking, initial encounter [T17.308A]  DISCHARGE DIAGNOSIS:  Active Problems:   Cardiorespiratory arrest (HCC)   Anoxic brain injury (HCC)   Altered consciousness   Cardiac arrest (HCC)   Respiratory failure (HCC)   SECONDARY DIAGNOSIS:   Past Medical History  Diagnosis Date  . Dementia   . Hypertension   . Renal disorder   . Diabetes mellitus without complication (HCC)   . COPD (chronic obstructive pulmonary disease) (HCC)   . GERD (gastroesophageal reflux disease)     .pro HOSPITAL COURSE:   Patient is 82 year old African-American female with past medical history significant for history of dementia, hypertension, diabetes, COPD, who presented to the hospital after cardiac arrest. Patient was found to be in asystole, she received epinephrine and chest compressions for approximately 15 minutes. She was brought to emergency room for further evaluation. She was intubated and large amount of yellow thick secretions and food material was suctioned from airways. Post resuscitation. Patient was noted to have severe neurologic impairment, she was seen by neurologist and felt that she has progressive cerebral edema. The electroencephalogram revealed no cerebral activity. Patient was consulted by palliative care and family decided to terminally extubate patient. Palliative care. Discussed this patient's family her care and treatment plan, family decided on hospice home admission. Patient will be discharged to hospice home today. Discussion by problem #1 Ventilator-dependent respiratory  failure, terminally extubated on April 18, family chose comfort care measures, patient remains on morphine drip, she will be discharged to hospice home today as bed is available, as discussed with palliative care physician, Dr. Orvan Falconer #2. Aspiration event, supportive therapy. Oxygen nasally #3 cardiac arrests, no intervention, supportive therapy since patient is comfort care #4. Anoxic brain injury, no intervention comfort care only #5. Lactic acidosis, no IV fluids, supportive care only #6. Diabetes mellitus type 2, no sliding scale insulin, as patient is not eating, no IV fluids, supportive care only #7. CK D stage III, supportive care Patient has very poor prognosis. Palliative care measures. This family daily, they were in agreement, no artificial feedings of fluid, continue ongoing morphine drip, other symptom management as  patient's comfort needs dictate. Patient had apneic episodes on 01/27/2016, however, stabilized on the 20th. She is being transferred to hospice home today for final care DISCHARGE CONDITIONS:   Poor  CONSULTS OBTAINED:  Treatment Team:  Pauletta Browns, MD  DRUG ALLERGIES:  No Known Allergies  DISCHARGE MEDICATIONS:   Current Discharge Medication List    START taking these medications   Details  bisacodyl (DULCOLAX) 10 MG suppository Place 1 suppository (10 mg total) rectally daily as needed for moderate constipation. Qty: 12 suppository, Refills: 0    morphine 250 mg in dextrose 5 % 250 mL Inject 7 mg/hr into the vein continuous. Qty: 100 mL, Refills: 0    prochlorperazine (COMPAZINE) 25 MG suppository Place 1 suppository (25 mg total) rectally every 8 (eight) hours as needed for nausea or vomiting. Qty: 6 suppository, Refills: 0      STOP taking these medications     albuterol (PROVENTIL) (2.5 MG/3ML) 0.083% nebulizer solution  alum & mag hydroxide-simeth (MINTOX) 200-200-20 MG/5ML suspension      aspirin EC 81 MG tablet      bisacodyl  (DULCOLAX) 5 MG EC tablet      dextrose (GLUTOSE) 40 % GEL      divalproex (DEPAKOTE SPRINKLE) 125 MG capsule      docusate sodium (COLACE) 100 MG capsule      fluticasone (FLONASE) 50 MCG/ACT nasal spray      fluticasone furoate-vilanterol (BREO ELLIPTA) 100-25 MCG/INH AEPB      gabapentin (NEURONTIN) 300 MG capsule      HYDROcodone-acetaminophen (NORCO/VICODIN) 5-325 MG tablet      insulin detemir (LEVEMIR) 100 UNIT/ML injection      insulin regular (NOVOLIN R,HUMULIN R) 100 units/mL injection      insulin regular (NOVOLIN R,HUMULIN R) 100 units/mL injection      isosorbide mononitrate (IMDUR) 60 MG 24 hr tablet      losartan (COZAAR) 50 MG tablet      omeprazole (PRILOSEC) 20 MG capsule      polyethylene glycol (MIRALAX / GLYCOLAX) packet      simethicone (GAS-X) 80 MG chewable tablet      trolamine salicylate (ASPERCREME) 10 % cream      umeclidinium bromide (INCRUSE ELLIPTA) 62.5 MCG/INH AEPB          DISCHARGE INSTRUCTIONS:    Patient will have no follow-up  If you experience worsening of your admission symptoms, develop shortness of breath, life threatening emergency, suicidal or homicidal thoughts you must seek medical attention immediately by calling 911 or calling your MD immediately  if symptoms less severe.  You Must read complete instructions/literature along with all the possible adverse reactions/side effects for all the Medicines you take and that have been prescribed to you. Take any new Medicines after you have completely understood and accept all the possible adverse reactions/side effects.   Please note  You were cared for by a hospitalist during your hospital stay. If you have any questions about your discharge medications or the care you received while you were in the hospital after you are discharged, you can call the unit and asked to speak with the hospitalist on call if the hospitalist that took care of you is not available. Once you are  discharged, your primary care physician will handle any further medical issues. Please note that NO REFILLS for any discharge medications will be authorized once you are discharged, as it is imperative that you return to your primary care physician (or establish a relationship with a primary care physician if you do not have one) for your aftercare needs so that they can reassess your need for medications and monitor your lab values.    Today   CHIEF COMPLAINT:   Chief Complaint  Patient presents with  . Post Arrest     HISTORY OF PRESENT ILLNESS:  Lizann Edelman  is a 82 y.o. female with a known history of dementia, hypertension, diabetes, COPD, who presented to the hospital after cardiac arrest. Patient was found to be in asystole, she received epinephrine and chest compressions for approximately 15 minutes. She was brought to emergency room for further evaluation. She was intubated and large amount of yellow thick secretions and food material was suctioned from airways. Post resuscitation. Patient was noted to have severe neurologic impairment, she was seen by neurologist and felt that she has progressive cerebral edema. The electroencephalogram revealed no cerebral activity. Patient was consulted by palliative care and family decided to terminally  extubate patient. Palliative care. Discussed this patient's family her care and treatment plan, family decided on hospice home admission. Patient will be discharged to hospice home today. Discussion by problem #1 Ventilator-dependent respiratory failure, terminally extubated on April 18, family chose comfort care measures, patient remains on morphine drip, she will be discharged to hospice home today as bed is available, as discussed with palliative care physician, Dr. Orvan Falconerampbell #2. Aspiration event, supportive therapy. Oxygen nasally #3 cardiac arrests, no intervention, supportive therapy since patient is comfort care #4. Anoxic brain injury, no  intervention comfort care only #5. Lactic acidosis, no IV fluids, supportive care only #6. Diabetes mellitus type 2, no sliding scale insulin, as patient is not eating, no IV fluids, supportive care only #7. CK D stage III, supportive care Patient has very poor prognosis. Palliative care measures. This family daily, they were in agreement, no artificial feedings of fluid, continue ongoing morphine drip, other symptom management as  patient's comfort needs dictate. Patient had apneic episodes on 01/27/2016, however, stabilized on the 20th. She is being transferred to hospice home today for final care DISCHARGE CONDITIONS:     VITAL SIGNS:  Blood pressure 126/47, pulse 81, temperature 97.7 F (36.5 C), temperature source Oral, resp. rate 16, height 5\' 3"  (1.6 m), weight 90.9 kg (200 lb 6.4 oz), SpO2 93 %.  I/O:   Intake/Output Summary (Last 24 hours) at 01/28/16 1226 Last data filed at 01/28/16 0622  Gross per 24 hour  Intake      0 ml  Output    125 ml  Net   -125 ml    PHYSICAL EXAMINATION:  GENERAL:  82 y.o.-year-old patient lying in the bed with no acute distress.  EYES: Pupils equal, round, reactive to light and accommodation. No scleral icterus. Extraocular muscles intact.  HEENT: Head atraumatic, normocephalic. Oropharynx and nasopharynx clear.  NECK:  Supple, no jugular venous distention. No thyroid enlargement, no tenderness.  LUNGS: Normal breath sounds bilaterally, no wheezing, rales,rhonchi or crepitation. No use of accessory muscles of respiration.  CARDIOVASCULAR: S1, S2 normal. No murmurs, rubs, or gallops.  ABDOMEN: Soft, non-tender, non-distended. Bowel sounds present. No organomegaly or mass.  EXTREMITIES: No pedal edema, cyanosis, or clubbing.  NEUROLOGIC: Cranial nerves II through XII are intact. Muscle strength 5/5 in all extremities. Sensation intact. Gait not checked.  PSYCHIATRIC: The patient is alert and oriented x 3.  SKIN: No obvious rash, lesion, or ulcer.    DATA REVIEW:   CBC  Recent Labs Lab 01/25/16 0458  WBC 10.0  HGB 10.6*  HCT 30.3*  PLT 173    Chemistries   Recent Labs Lab 01/23/16 0602 01/24/16 0447 01/25/16 0458  NA 132* 134* 135  K 3.7 3.3* 3.7  CL 103 102 104  CO2 23 23 24   GLUCOSE 159* 255* 183*  BUN 23* 26* 21*  CREATININE 1.19* 1.35* 0.87  CALCIUM 7.7* 8.2* 8.4*  MG 1.7 1.9  --   AST 63*  --   --   ALT 27  --   --   ALKPHOS 44  --   --   BILITOT 0.7  --   --     Cardiac Enzymes  Recent Labs Lab 01/22/16 1056  TROPONINI 0.79*    Microbiology Results  Results for orders placed or performed during the hospital encounter of 01/21/16  MRSA PCR Screening     Status: None   Collection Time: 01/21/16 10:39 PM  Result Value Ref Range Status   MRSA by  PCR NEGATIVE NEGATIVE Final    Comment:        The GeneXpert MRSA Assay (FDA approved for NASAL specimens only), is one component of a comprehensive MRSA colonization surveillance program. It is not intended to diagnose MRSA infection nor to guide or monitor treatment for MRSA infections.   Culture, expectorated sputum-assessment     Status: None   Collection Time: 01/23/16 11:33 AM  Result Value Ref Range Status   Specimen Description EXPECTORATED SPUTUM  Final   Special Requests NONE  Final   Sputum evaluation THIS SPECIMEN IS ACCEPTABLE FOR SPUTUM CULTURE  Final   Report Status 01/23/2016 FINAL  Final  Culture, respiratory (NON-Expectorated)     Status: None   Collection Time: 01/23/16 11:33 AM  Result Value Ref Range Status   Specimen Description EXPECTORATED SPUTUM  Final   Special Requests NONE Reflexed from [id number]  Final   Gram Stain   Final    FEW WBC SEEN MANY GRAM POSITIVE RODS RARE GRAM POSITIVE COCCI    Culture   Final    LIGHT GROWTH METHICILLIN RESISTANT STAPHYLOCOCCUS AUREUS   Report Status 01/27/2016 FINAL  Final   Organism ID, Bacteria METHICILLIN RESISTANT STAPHYLOCOCCUS AUREUS  Final      Susceptibility    Methicillin resistant staphylococcus aureus - MIC*    CIPROFLOXACIN >=8 RESISTANT Resistant     ERYTHROMYCIN >=8 RESISTANT Resistant     GENTAMICIN <=0.5 SENSITIVE Sensitive     OXACILLIN >=4 RESISTANT Resistant     TETRACYCLINE <=1 SENSITIVE Sensitive     VANCOMYCIN 1 SENSITIVE Sensitive     TRIMETH/SULFA <=10 SENSITIVE Sensitive     CLINDAMYCIN >=8 RESISTANT Resistant     RIFAMPIN <=0.5 SENSITIVE Sensitive     Inducible Clindamycin NEGATIVE Sensitive     * LIGHT GROWTH METHICILLIN RESISTANT STAPHYLOCOCCUS AUREUS  CULTURE, BLOOD (ROUTINE X 2) w Reflex to PCR ID Panel     Status: None (Preliminary result)   Collection Time: 01/24/16  4:46 AM  Result Value Ref Range Status   Specimen Description BLOOD LEFT ARM  Final   Special Requests   Final    BOTTLES DRAWN AEROBIC AND ANAEROBIC 10CCAERO,10CCANA   Culture NO GROWTH 4 DAYS  Final   Report Status PENDING  Incomplete  CULTURE, BLOOD (ROUTINE X 2) w Reflex to PCR ID Panel     Status: None (Preliminary result)   Collection Time: 01/24/16  4:47 AM  Result Value Ref Range Status   Specimen Description BLOOD RIGHT HAND  Final   Special Requests BOTTLES DRAWN AEROBIC AND ANAEROBIC 8CCAERO,8CCANA  Final   Culture NO GROWTH 4 DAYS  Final   Report Status PENDING  Incomplete    RADIOLOGY:  No results found.  EKG:   Orders placed or performed during the hospital encounter of 01/21/16  . EKG 12-Lead  . EKG 12-Lead      Management plans discussed with the patient, family and they are in agreement.  CODE STATUS:     Code Status Orders        Start     Ordered   01/26/16 1308  Do not attempt resuscitation (DNR)   Continuous    Question Answer Comment  In the event of cardiac or respiratory ARREST Do not call a "code blue"   In the event of cardiac or respiratory ARREST Do not perform Intubation, CPR, defibrillation or ACLS   In the event of cardiac or respiratory ARREST Use medication by any route, position, wound care,  and  other measures to relive pain and suffering. May use oxygen, suction and manual treatment of airway obstruction as needed for comfort.      01/26/16 1309    Code Status History    Date Active Date Inactive Code Status Order ID Comments User Context   01/25/2016  3:00 PM 01/26/2016  1:09 PM DNR [id number]  Erin Fulling, MD Inpatient   01/21/2016 10:50 PM 01/25/2016  3:00 PM Full Code [id number]  Enedina Finner, MD Inpatient      TOTAL TIME TAKING CARE OF THIS PATIENT: 40 minutes.    Katharina Caper M.D on 01/28/2016 at 12:26 PM  Between 7am to 6pm - Pager - [phone]  After 6pm go to [email or web address] - password [email or web address] [facility]  [facility]  Office  [phone]  CC: Primary care physician; No PCP Per Patient

## 2016-01-29 LAB — CULTURE, BLOOD (ROUTINE X 2)
CULTURE: NO GROWTH
Culture: NO GROWTH

## 2016-02-08 DEATH — deceased
